# Patient Record
Sex: Male | Born: 1980 | Race: White | Hispanic: No | Marital: Married | Smoking: Never smoker
Health system: Southern US, Community
[De-identification: ages and names within clinical notes are randomized; demographics above are authoritative.]

---

## 2016-08-12 ENCOUNTER — Ambulatory Visit: Payer: Self-pay | Admitting: Physician Assistant

## 2016-08-12 ENCOUNTER — Encounter: Payer: Self-pay | Admitting: Physician Assistant

## 2016-08-12 VITALS — BP 120/84 | HR 78 | Temp 98.6°F

## 2016-08-12 DIAGNOSIS — W57XXXA Bitten or stung by nonvenomous insect and other nonvenomous arthropods, initial encounter: Secondary | ICD-10-CM

## 2016-08-12 MED ORDER — DOXYCYCLINE HYCLATE 100 MG PO TABS: 100.0000 mg | ORAL_TABLET | Freq: Two times a day (BID) | ORAL | 0 refills | Status: DC

## 2016-08-12 NOTE — Progress Notes (Signed)
S:  Body aches, weakness, + fever, chills, had sore throat on day 1 but not now, denies cp/sob, v/d; did pull a few ticks off. Last time was about 7 days ago  Using otc meds: advil, tylenol  O: PE: vitals wnl, nad,  perrl eomi, normocephalic, tms dull,  throat injected, neck supple no lymph, lungs c t a, cv rrr, neuro intact,   A:  Tick bite  P: drink fluids, continue regular meds , use otc meds of choice, return if not improving in 5 days, return earlier if worsening  Doxy 100mg  bid x 14d

## 2016-10-15 ENCOUNTER — Encounter: Payer: Self-pay | Admitting: Physician Assistant

## 2016-10-15 ENCOUNTER — Ambulatory Visit: Payer: Self-pay | Admitting: Family

## 2016-10-15 VITALS — BP 120/80 | HR 77 | Temp 98.0°F

## 2016-10-15 DIAGNOSIS — R059 Cough, unspecified: Secondary | ICD-10-CM

## 2016-10-15 DIAGNOSIS — R05 Cough: Secondary | ICD-10-CM

## 2016-10-15 MED ORDER — PREDNISONE 10 MG (21) PO TBPK: ORAL_TABLET | ORAL | 0 refills | Status: DC

## 2016-10-15 MED ORDER — AZITHROMYCIN 250 MG PO TABS: ORAL_TABLET | ORAL | 0 refills | Status: DC

## 2016-10-15 NOTE — Progress Notes (Signed)
S/: Three-week history of cough, began nasally now  productive of yellow-green mucus, shortness of breath/tightness on exertion. He has been working and Pensions consultant. Denies fever or chills. His children had a similar illness which resolved without medical treatment. Denies past history of asthma or pneumonia. History of bronchitis 1  O/vital signs stable x PO2 95 %  alert pleasant healthy young man with alternating dry then deep cough ENT unremarkable neck supple without nodes heart RSR chest rhonchi rales right chest A/cough ? Pneumonia P/neb treatment given with subjective improvement. PO2 up to 97% sounds in right chest have diminished . Rx Z-Pak and Pred taper 6 days. Supportive measures discussed and encouraged follow-up when necessary not improving.

## 2016-10-22 ENCOUNTER — Ambulatory Visit: Payer: Self-pay | Admitting: Physician Assistant

## 2016-10-23 ENCOUNTER — Encounter: Payer: Self-pay | Admitting: Physician Assistant

## 2016-10-23 ENCOUNTER — Ambulatory Visit: Payer: Self-pay | Admitting: Physician Assistant

## 2016-10-23 VITALS — BP 120/80 | HR 86 | Temp 98.6°F

## 2016-10-23 DIAGNOSIS — R05 Cough: Secondary | ICD-10-CM

## 2016-10-23 DIAGNOSIS — R059 Cough, unspecified: Secondary | ICD-10-CM

## 2016-10-23 MED ORDER — PSEUDOEPH-BROMPHEN-DM 30-2-10 MG/5ML PO SYRP: 5.0000 mL | ORAL_SOLUTION | Freq: Four times a day (QID) | ORAL | 0 refills | Status: DC | PRN

## 2016-10-23 NOTE — Progress Notes (Signed)
   Subjective:    Patient ID: Daniel Miller, male    DOB: 1980-04-18, 36 y.o.   MRN: [id number]  HPI Patient c/o green productive cough for one week. Seen last week and prescribed  Z-Pack and Prednisone 2nd to cough and wheezing. State wheezing as improved.. Took flu shot yesterday.   Review of Systems Negative except for compliant.    Objective:   Physical Exam HEENT unremarkable. Neck supple w/o adenopathy. Lungs CTA and Heart RRR       Assessment & Plan:Cough  [place] DM. Follow up 4 days if no improvement.

## 2016-10-28 ENCOUNTER — Ambulatory Visit
Admission: RE | Admit: 2016-10-28 | Discharge: 2016-10-28 | Disposition: A | Discharge status: Home / Self Care | Payer: 59 | Source: Ambulatory Visit | Attending: Physician Assistant | Admitting: Physician Assistant

## 2016-10-28 ENCOUNTER — Telehealth: Payer: Self-pay | Admitting: Physician Assistant

## 2016-10-28 DIAGNOSIS — R05 Cough: Secondary | ICD-10-CM

## 2016-10-28 DIAGNOSIS — R059 Cough, unspecified: Secondary | ICD-10-CM

## 2016-10-28 DIAGNOSIS — Z0000 Encounter for general adult medical examination without abnormal findings: Secondary | ICD-10-CM | POA: Diagnosis not present

## 2016-10-28 DIAGNOSIS — R918 Other nonspecific abnormal finding of lung field: Secondary | ICD-10-CM | POA: Insufficient documentation

## 2016-10-28 NOTE — Telephone Encounter (Signed)
Pt called the clinic and would like a cxr since the steroid pack didn't really help, order placed in epic

## 2016-10-28 NOTE — Progress Notes (Signed)
Patient contacted office on 10/28/2016 requesting CXR not feeling any better. Per Manuela Schwartz order was placed and patient was notified.

## 2016-10-30 ENCOUNTER — Encounter: Payer: Self-pay | Admitting: Physician Assistant

## 2016-10-30 ENCOUNTER — Ambulatory Visit: Payer: Self-pay | Admitting: Physician Assistant

## 2016-10-30 VITALS — BP 120/80 | HR 81 | Temp 98.4°F

## 2016-10-30 MED ORDER — ALBUTEROL SULFATE HFA 108 (90 BASE) MCG/ACT IN AERS: 2.0000 | INHALATION_SPRAY | Freq: Four times a day (QID) | RESPIRATORY_TRACT | 0 refills | Status: DC | PRN

## 2016-10-30 NOTE — Progress Notes (Signed)
S: C/o continued cough, sx for over 3 weeks, entire family was sick, Daniel Miller gotten better but his cough is lingering, finished zpack, steroid pack, had cxr that showed bronchial changes and hyperinflation, states he got a flu shot a week ago, also went out with his cousin 3 weeks ago and went to a hookah bar, ?if that could have aggravated his lungs?; cough is dry and hacking; is not keeping pt awake at night;  denies cardiac type chest pain or sob, v/d, abd pain, states he flet a lot better while he was on the steroids Remainder ros neg  O: vitals wnl, nad, tms clear, throat injected, neck supple no lymph, lungs c t a, cv rrr, neuro intact, cough is spasmodic, dry  A:  Acute bronchitis   P:  rx medication: albuterol inhaler for 1 week, if not better with inhaler will refer to pulmonology, use otc meds, tylenol or motrin as needed for fever/chills, return if not better in 3 -5 days, return earlier if worsening

## 2016-11-18 ENCOUNTER — Ambulatory Visit: Payer: Self-pay | Admitting: Urology

## 2016-11-24 ENCOUNTER — Ambulatory Visit: Payer: Self-pay | Admitting: Urology

## 2017-02-02 DIAGNOSIS — Z3009 Encounter for other general counseling and advice on contraception: Secondary | ICD-10-CM | POA: Diagnosis not present

## 2017-02-10 ENCOUNTER — Ambulatory Visit: Payer: Self-pay | Admitting: Urology

## 2017-03-30 DIAGNOSIS — Z302 Encounter for sterilization: Secondary | ICD-10-CM | POA: Diagnosis not present

## 2017-12-04 DIAGNOSIS — Z0000 Encounter for general adult medical examination without abnormal findings: Secondary | ICD-10-CM | POA: Diagnosis not present

## 2017-12-07 DIAGNOSIS — Z0000 Encounter for general adult medical examination without abnormal findings: Secondary | ICD-10-CM | POA: Diagnosis not present

## 2017-12-07 DIAGNOSIS — E559 Vitamin D deficiency, unspecified: Secondary | ICD-10-CM | POA: Diagnosis not present

## 2018-03-04 DIAGNOSIS — R1013 Epigastric pain: Secondary | ICD-10-CM | POA: Diagnosis not present

## 2018-04-20 MED FILL — PANTOPRAZOLE SOD DR 40 MG T: 40 | 30 days supply | Qty: 30 | Fill #0

## 2018-04-20 MED FILL — SUCRALFATE 1 GM TABLET: 1 | 30 days supply | Qty: 60 | Fill #0

## 2018-04-28 DIAGNOSIS — R1012 Left upper quadrant pain: Secondary | ICD-10-CM | POA: Diagnosis not present

## 2018-04-28 DIAGNOSIS — K219 Gastro-esophageal reflux disease without esophagitis: Secondary | ICD-10-CM | POA: Diagnosis not present

## 2018-04-29 DIAGNOSIS — R1012 Left upper quadrant pain: Secondary | ICD-10-CM | POA: Diagnosis not present

## 2018-05-16 IMAGING — CR DG CHEST 2V
2 series · 2 of 2 positions shown · non-contrast
Comparison: None.

CLINICAL DATA: Shortness of breath, cough

EXAM:
CHEST  2 VIEW

[chest pa]
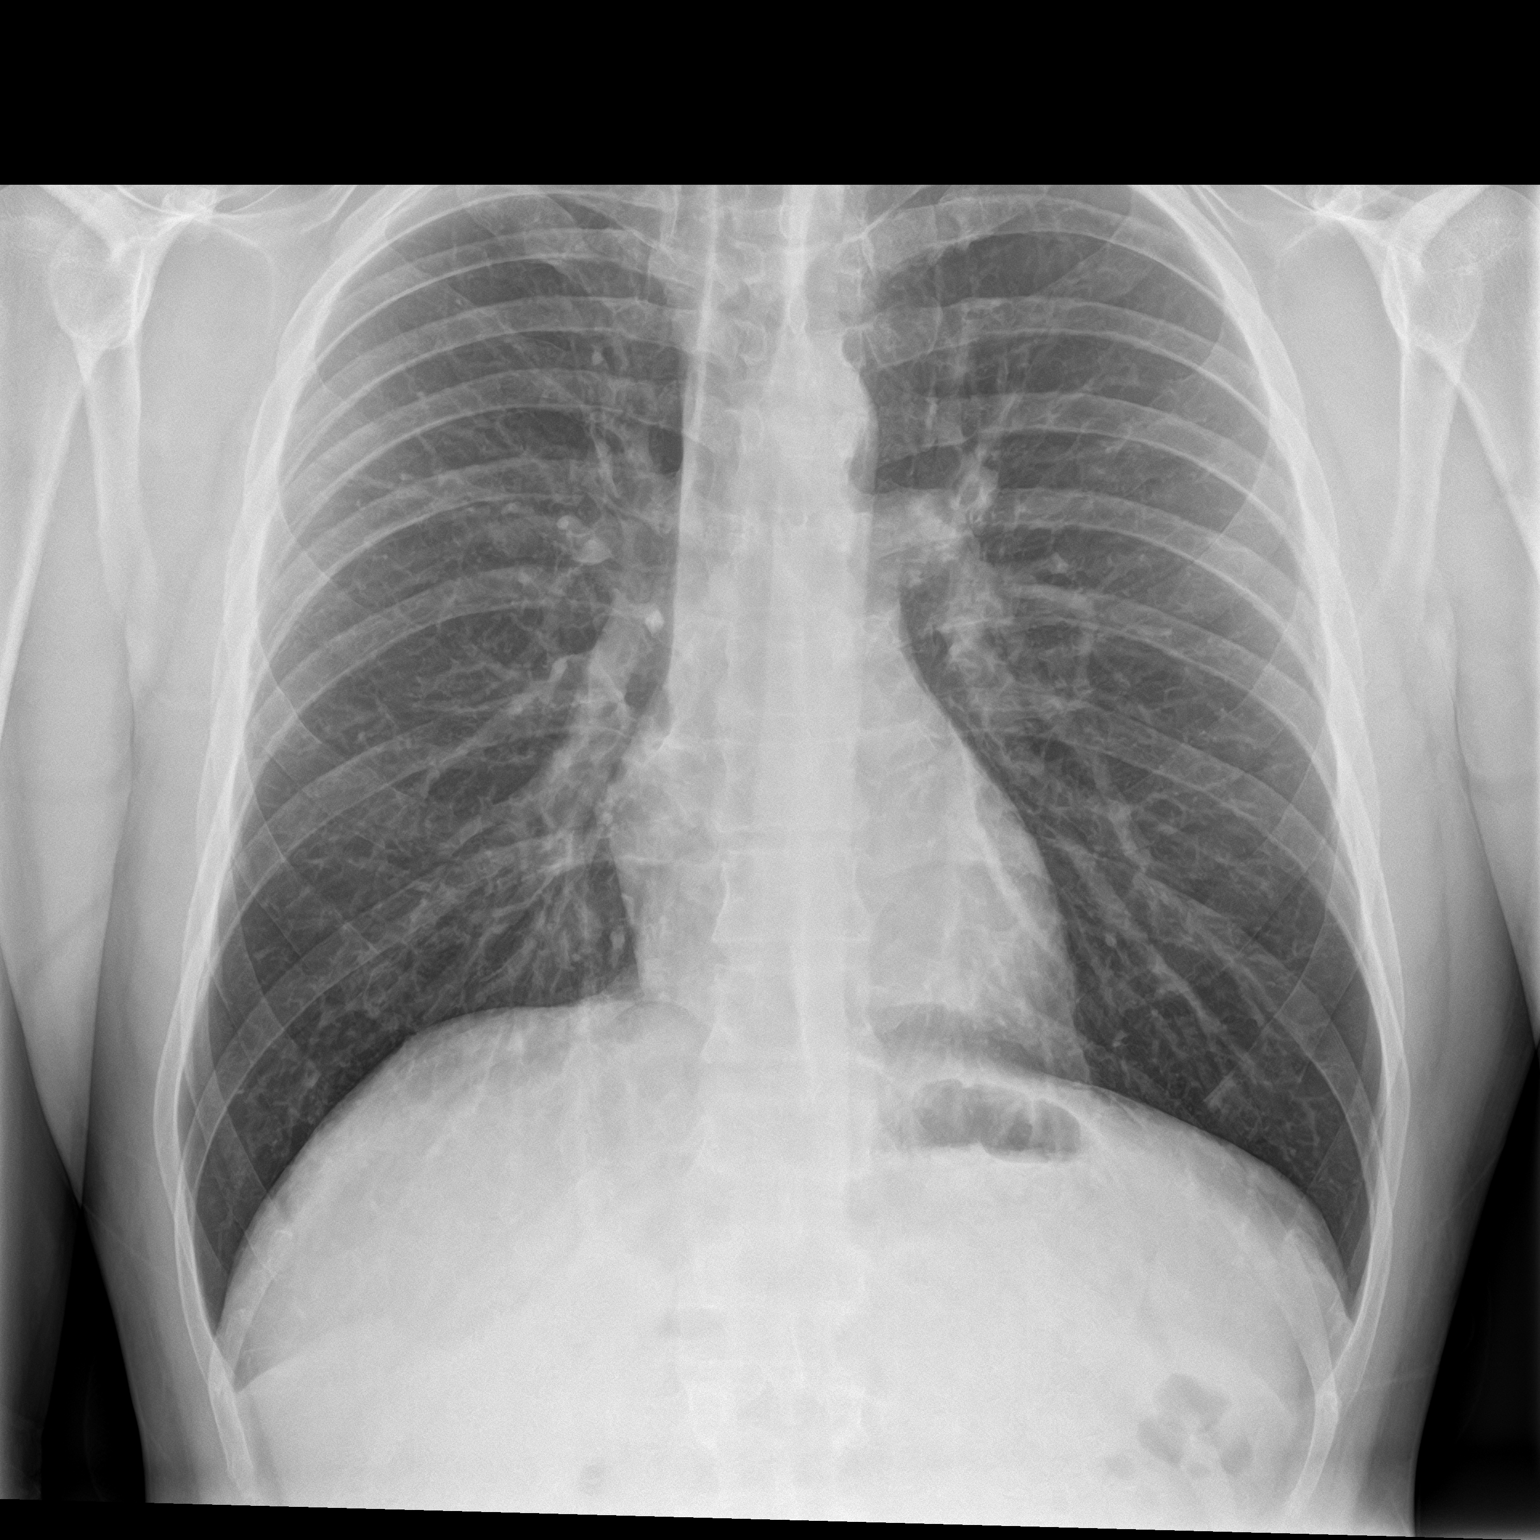

[chest lat]
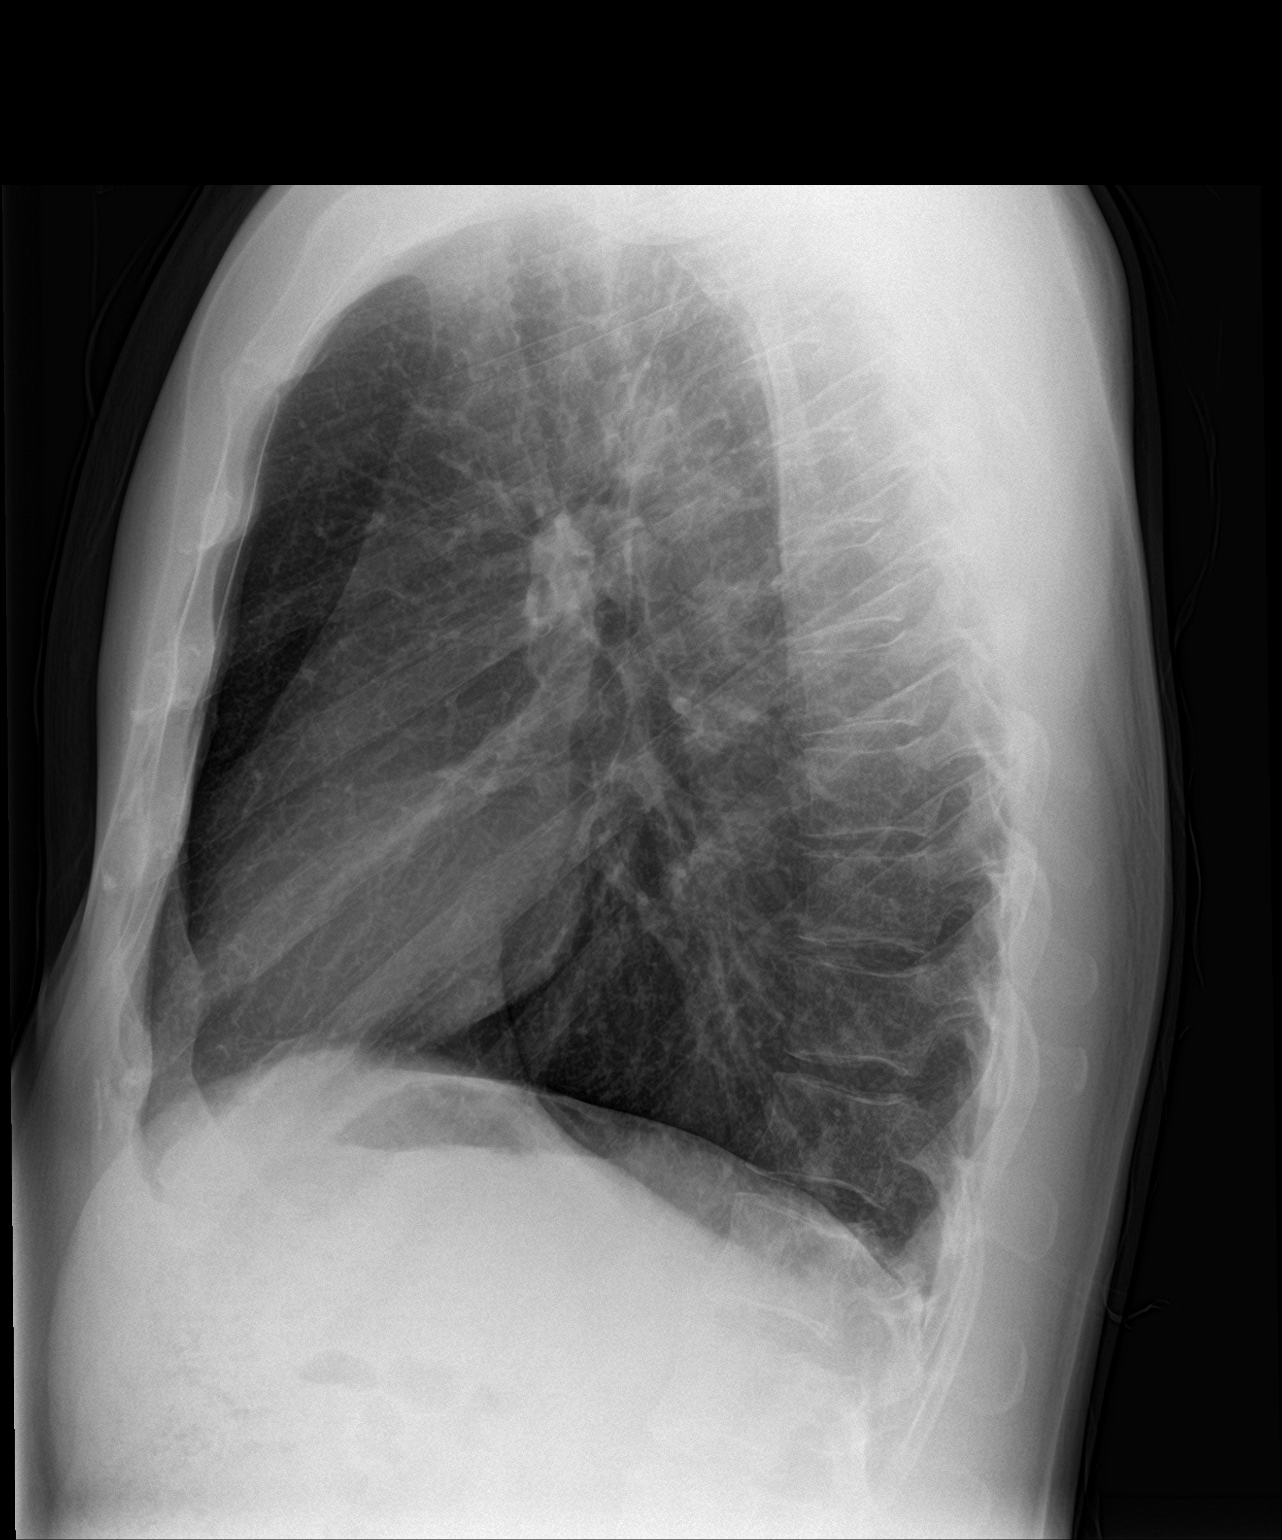

[2 of 2 positions shown; findings below may reference images not displayed]

FINDINGS: Mild hyperinflation of the lungs with peribronchial thickening.
Heart and mediastinal contours are within normal limits. No focal
opacities or effusions. No acute bony abnormality.
IMPRESSION: Mild hyperinflation and bronchitic changes.

## 2018-05-17 MED FILL — PANTOPRAZOLE SOD DR 40 MG T: 40 | 90 days supply | Qty: 90 | Fill #0

## 2018-06-30 DIAGNOSIS — R1013 Epigastric pain: Secondary | ICD-10-CM | POA: Diagnosis not present

## 2018-06-30 DIAGNOSIS — K219 Gastro-esophageal reflux disease without esophagitis: Secondary | ICD-10-CM | POA: Diagnosis not present

## 2018-07-14 DIAGNOSIS — K2980 Duodenitis without bleeding: Secondary | ICD-10-CM | POA: Diagnosis not present

## 2018-07-14 DIAGNOSIS — K449 Diaphragmatic hernia without obstruction or gangrene: Secondary | ICD-10-CM | POA: Diagnosis not present

## 2018-07-14 DIAGNOSIS — K222 Esophageal obstruction: Secondary | ICD-10-CM | POA: Diagnosis not present

## 2018-07-14 DIAGNOSIS — R12 Heartburn: Secondary | ICD-10-CM | POA: Diagnosis not present

## 2018-07-14 DIAGNOSIS — K228 Other specified diseases of esophagus: Secondary | ICD-10-CM | POA: Diagnosis not present

## 2018-09-01 DIAGNOSIS — L305 Pityriasis alba: Secondary | ICD-10-CM | POA: Diagnosis not present

## 2018-09-01 DIAGNOSIS — D225 Melanocytic nevi of trunk: Secondary | ICD-10-CM | POA: Diagnosis not present

## 2018-09-01 DIAGNOSIS — Z85828 Personal history of other malignant neoplasm of skin: Secondary | ICD-10-CM | POA: Diagnosis not present

## 2018-09-01 DIAGNOSIS — L814 Other melanin hyperpigmentation: Secondary | ICD-10-CM | POA: Diagnosis not present

## 2018-11-04 MED FILL — PANTOPRAZOLE SOD DR 40 MG T: 40 | 90 days supply | Qty: 90 | Fill #0

## 2018-12-03 ENCOUNTER — Telehealth: Payer: 59 | Admitting: Physician Assistant

## 2018-12-03 DIAGNOSIS — L255 Unspecified contact dermatitis due to plants, except food: Secondary | ICD-10-CM

## 2018-12-03 MED ORDER — DIPHENHYDRAMINE HCL 25 MG PO CAPS: 25.0000 mg | ORAL_CAPSULE | Freq: Four times a day (QID) | ORAL | 0 refills | Status: DC | PRN

## 2018-12-03 MED ORDER — DEXAMETHASONE 4 MG PO TABS: ORAL_TABLET | ORAL | 1 refills | Status: DC

## 2018-12-03 NOTE — Progress Notes (Signed)
I spent more than 5 min but less than 10 min on this E-visit.   E Visit for Rash  We are sorry that you are not feeling well. Here is how we plan to help!  Based on what you shared with me it looks like you have contact dermatitis.  Contact dermatitis is a skin rash caused by something that touches the skin and causes irritation or inflammation.  Your skin may be red, swollen, dry, cracked, and itch.  The rash should go away in a few days but can last a few weeks.  If you get a rash, it's important to figure out what caused it so the irritant can be avoided in the future.  I am going to order benadryl and decadron for your rash. Please see your MD or clinic if not improving.   HOME CARE:   Take cool showers and avoid direct sunlight.  Apply cool compress or wet dressings.  Take a bath in an oatmeal bath.  Sprinkle content of one Aveeno packet under running faucet with comfortably warm water.  Bathe for 15-20 minutes, 1-2 times daily.  Pat dry with a towel. Do not rub the rash.  Use hydrocortisone cream.  Take an antihistamine like Benadryl for widespread rashes that itch.  The adult dose of Benadryl is 25-50 mg by mouth 4 times daily.  Caution:  This type of medication may cause sleepiness.  Do not drink alcohol, drive, or operate dangerous machinery while taking antihistamines.  Do not take these medications if you have prostate enlargement.  Read package instructions thoroughly on all medications that you take.  GET HELP RIGHT AWAY IF:   Symptoms don't go away after treatment.  Severe itching that persists.  If you rash spreads or swells.  If you rash begins to smell.  If it blisters and opens or develops a yellow-brown crust.  You develop a fever.  You have a sore throat.  You become short of breath.  MAKE SURE YOU:  Understand these instructions. Will watch your condition. Will get help right away if you are not doing well or get worse.  Thank you for choosing  an e-visit. Your e-visit answers were reviewed by a board certified advanced clinical practitioner to complete your personal care plan. Depending upon the condition, your plan could have included both over the counter or prescription medications. Please review your pharmacy choice. Be sure that the pharmacy you have chosen is open so that you can pick up your prescription now.  If there is a problem you may message your provider in [place] to have the prescription routed to another pharmacy. Your safety is important to [place]. If you have drug allergies check your prescription carefully.  For the next 24 hours, you can use MyChart to ask questions about today's visit, request a non-urgent call back, or ask for a work or school excuse from your e-visit provider. You will get an email in the next two days asking about your experience. I hope that your e-visit has been valuable and will speed your recovery.

## 2018-12-09 DIAGNOSIS — F410 Panic disorder [episodic paroxysmal anxiety] without agoraphobia: Secondary | ICD-10-CM | POA: Diagnosis not present

## 2018-12-09 DIAGNOSIS — Z0000 Encounter for general adult medical examination without abnormal findings: Secondary | ICD-10-CM | POA: Diagnosis not present

## 2018-12-09 DIAGNOSIS — E559 Vitamin D deficiency, unspecified: Secondary | ICD-10-CM | POA: Diagnosis not present

## 2018-12-09 DIAGNOSIS — Z13220 Encounter for screening for lipoid disorders: Secondary | ICD-10-CM | POA: Diagnosis not present

## 2018-12-09 DIAGNOSIS — K219 Gastro-esophageal reflux disease without esophagitis: Secondary | ICD-10-CM | POA: Diagnosis not present

## 2018-12-09 MED FILL — ALPRAZolam 0.25 MG TABS: 0.25 | 30 days supply | Qty: 30 | Fill #0

## 2019-08-02 DIAGNOSIS — L821 Other seborrheic keratosis: Secondary | ICD-10-CM | POA: Diagnosis not present

## 2019-08-02 DIAGNOSIS — D1801 Hemangioma of skin and subcutaneous tissue: Secondary | ICD-10-CM | POA: Diagnosis not present

## 2019-08-02 DIAGNOSIS — D225 Melanocytic nevi of trunk: Secondary | ICD-10-CM | POA: Diagnosis not present

## 2019-08-02 DIAGNOSIS — L812 Freckles: Secondary | ICD-10-CM | POA: Diagnosis not present

## 2019-08-02 DIAGNOSIS — Z85828 Personal history of other malignant neoplasm of skin: Secondary | ICD-10-CM | POA: Diagnosis not present

## 2019-08-02 DIAGNOSIS — D2271 Melanocytic nevi of right lower limb, including hip: Secondary | ICD-10-CM | POA: Diagnosis not present

## 2019-08-30 MED FILL — PANTOPRAZOLE SOD DR 40 MG T: 40 | 90 days supply | Qty: 90 | Fill #0

## 2019-12-05 DIAGNOSIS — M25561 Pain in right knee: Secondary | ICD-10-CM | POA: Diagnosis not present

## 2019-12-09 ENCOUNTER — Other Ambulatory Visit: Payer: Self-pay | Admitting: Orthopedic Surgery

## 2019-12-09 DIAGNOSIS — M25561 Pain in right knee: Secondary | ICD-10-CM

## 2019-12-13 ENCOUNTER — Other Ambulatory Visit (HOSPITAL_COMMUNITY): Payer: Self-pay | Admitting: Family Medicine

## 2019-12-13 DIAGNOSIS — F410 Panic disorder [episodic paroxysmal anxiety] without agoraphobia: Secondary | ICD-10-CM | POA: Diagnosis not present

## 2019-12-13 DIAGNOSIS — Z0000 Encounter for general adult medical examination without abnormal findings: Secondary | ICD-10-CM | POA: Diagnosis not present

## 2019-12-13 DIAGNOSIS — Z13220 Encounter for screening for lipoid disorders: Secondary | ICD-10-CM | POA: Diagnosis not present

## 2019-12-13 DIAGNOSIS — E559 Vitamin D deficiency, unspecified: Secondary | ICD-10-CM | POA: Diagnosis not present

## 2019-12-13 DIAGNOSIS — K219 Gastro-esophageal reflux disease without esophagitis: Secondary | ICD-10-CM | POA: Diagnosis not present

## 2019-12-13 MED FILL — ALPRAZolam 0.25 MG TABS: 0.25 | 30 days supply | Qty: 30 | Fill #0

## 2020-01-02 ENCOUNTER — Ambulatory Visit
Admission: RE | Admit: 2020-01-02 | Discharge: 2020-01-02 | Disposition: A | Discharge status: Home / Self Care | Payer: 59 | Source: Ambulatory Visit | Attending: Orthopedic Surgery | Admitting: Orthopedic Surgery

## 2020-01-02 ENCOUNTER — Other Ambulatory Visit: Payer: Self-pay

## 2020-01-02 DIAGNOSIS — M25561 Pain in right knee: Secondary | ICD-10-CM

## 2020-01-02 DIAGNOSIS — M23341 Other meniscus derangements, anterior horn of lateral meniscus, right knee: Secondary | ICD-10-CM | POA: Diagnosis not present

## 2020-01-03 ENCOUNTER — Ambulatory Visit: Payer: 59 | Admitting: Dietician

## 2020-03-26 ENCOUNTER — Ambulatory Visit: Payer: 59 | Admitting: Neurology

## 2020-03-26 ENCOUNTER — Encounter: Payer: Self-pay | Admitting: Neurology

## 2020-03-26 VITALS — BP 115/76 | HR 67 | Ht 68.0 in | Wt 164.0 lb

## 2020-03-26 DIAGNOSIS — Z820 Family history of epilepsy and other diseases of the nervous system: Secondary | ICD-10-CM | POA: Diagnosis not present

## 2020-03-26 DIAGNOSIS — Z9189 Other specified personal risk factors, not elsewhere classified: Secondary | ICD-10-CM | POA: Diagnosis not present

## 2020-03-26 DIAGNOSIS — R0683 Snoring: Secondary | ICD-10-CM | POA: Diagnosis not present

## 2020-03-26 DIAGNOSIS — R351 Nocturia: Secondary | ICD-10-CM | POA: Diagnosis not present

## 2020-03-26 DIAGNOSIS — R0681 Apnea, not elsewhere classified: Secondary | ICD-10-CM | POA: Diagnosis not present

## 2020-03-26 NOTE — Progress Notes (Signed)
Subjective:    Patient ID: Daniel Miller is a 40 y.o. male.  HPI     Star Age, MD, PhD [facility] [address] [place], [place] [address]  Dear Dr. Harrington Challenger,   I saw your patient, Daniel Miller, upon your kind request, In my sleep clinic today for initial consultation of his sleep disorder, in particular, concern for underlying obstructive sleep apnea.  The patient is unaccompanied today.  As you know, [name] is a 40 year old right-handed gentleman with an underlying medical history of basal cell cancer, anxiety, reflux disease, who reports snoring and sleep disruption, often from anxiety at night.  He has had anxiety issues for years, probably close to 10 years but lately, in the past few months he has had nighttime anxiety, even panic-like feeling at night.  He does snore, he does not snore consistently loudly but more so when he has had alcohol.  He has been on Xanax as needed and tries to avoid taking it on a regular basis.  He often just takes a quarter of a pill or even smaller fraction.  He has noticed that he does not wake up as consistently when he has had alcohol.  He does not drink on a daily a regular basis but likes to drink some on weekends, mostly Friday and Saturday nights.  He lives with his wife and 2 children, they have 2 dogs in the household.  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  He does not watch TV in bed.  He tries to keep up scheduled for his sleep, generally in bed before 10:30 PM and rise time is around 6:30 AM.  He has nocturia once per average night, denies any telltale symptoms of restless leg syndrome or recurrent morning headaches.  His brother has sleep apnea and has an AutoPap machine.  Patient drinks caffeine in the form of coffee, generally 1 cup in the morning and has had a second cup at times in the afternoon, before 230.  When his anxiety flared up he quit drinking the second cup of coffee.  He drinks alcohol on the  weekends.  He quit smoking in 2009.  I reviewed your office note from 12/13/2019.  He had blood work through your office on 12/13/2019 and I reviewed the results: Total cholesterol 214, vitamin D 37.9, CMP unremarkable, B12 265.  His Epworth sleepiness score is 9 out of 24, fatigue severity score is 21 out of 63.  His Past Medical History Is Significant For: History reviewed. No pertinent past medical history.  His Past Surgical History Is Significant For: History reviewed. No pertinent surgical history.  His Family History Is Significant For: History reviewed. No pertinent family history.  His Social History Is Significant For: Social History   Socioeconomic History  . Marital status: Married    Spouse name: Not on file  . Number of children: Not on file  . Years of education: Not on file  . Highest education level: Not on file  Occupational History  . Not on file  Tobacco Use  . Smoking status: Never Smoker  . Smokeless tobacco: Never Used  Substance and Sexual Activity  . Alcohol use: Not on file  . Drug use: Not on file  . Sexual activity: Not on file  Other Topics Concern  . Not on file  Social History Narrative  . Not on file   Social Determinants of Health   Financial Resource Strain: Not on  file  Food Insecurity: Not on file  Transportation Needs: Not on file  Physical Activity: Not on file  Stress: Not on file  Social Connections: Not on file    His Allergies Are:  Not on File:   His Current Medications Are:  Outpatient Encounter Medications as of 03/26/2020  Medication Sig  . albuterol (PROVENTIL HFA;VENTOLIN HFA) 108 (90 Base) MCG/ACT inhaler Inhale 2 puffs into the lungs every 6 (six) hours as needed for wheezing or shortness of breath.  . ALPRAZolam (XANAX) 0.25 MG tablet   . brompheniramine-pseudoephedrine-DM 30-2-10 MG/5ML syrup Take 5 mLs by mouth 4 (four) times daily as needed.  Marland Kitchen dexamethasone (DECADRON) 4 MG tablet 1 tab po bid with food  .  diphenhydrAMINE (BENADRYL ALLERGY) 25 mg capsule Take 1 capsule (25 mg total) by mouth every 6 (six) hours as needed.  . pantoprazole (PROTONIX) 40 MG tablet Take 40 mg by mouth daily.  . [DISCONTINUED] ALPRAZolam (XANAX XR) 3 MG 24 hr tablet Frequency:TAKE TABLET  PRN   Dosage:0.0     Instructions:  Note:   No facility-administered encounter medications on file as of 03/26/2020.  :  Review of Systems:  Out of a complete 14 point review of systems, all are reviewed and negative with the exception of these symptoms as listed below: Review of Systems  Neurological:       Here for sleep consult. No prior sleep study some snoring is present. Brother has osa.  Epworth Sleepiness Scale 0= would never doze 1= slight chance of dozing 2= moderate chance of dozing 3= high chance of dozing  Sitting and reading:2 Watching TV:1 Sitting inactive in a public place (ex. Theater or meeting):1 As a passenger in a car for an hour without a break:1 Lying down to rest in the afternoon:2 Sitting and talking to someone:0 Sitting quietly after lunch (no alcohol):2 In a car, while stopped in traffic:0 Total:9     Objective:  Neurological Exam  Physical Exam Physical Examination:   Vitals:   03/26/20 1226  BP: 115/76  Pulse: 67    General Examination: The patient is a very pleasant 40 y.o. male in no acute distress. He appears well-developed and well-nourished and well groomed.   HEENT: Normocephalic, atraumatic, pupils are equal, round and reactive to light, extraocular tracking is good without limitation to gaze excursion or nystagmus noted. Hearing is grossly intact. Face is symmetric with normal facial animation. Speech is clear with no dysarthria noted. There is no hypophonia. There is no lip, neck/head, jaw or voice tremor. Neck is supple with full range of passive and active motion. There are no carotid bruits on auscultation. Oropharynx exam reveals: mild mouth dryness, good dental hygiene  and mild airway crowding, due to small airway entry, tonsillar size of about 1+, normal size uvula, normal tongue, Mallampati class II.  Neck circumference of 15-1/4 inches.  He has a mild overbite.  Tongue protrudes centrally and palate elevates symmetrically.  Chest: Clear to auscultation without wheezing, rhonchi or crackles noted.  Heart: S1+S2+0, regular and normal without murmurs, rubs or gallops noted.   Abdomen: Soft, non-tender and non-distended with normal bowel sounds appreciated on auscultation.  Extremities: There is no pitting edema in the distal lower extremities bilaterally.   Skin: Warm and dry without trophic changes noted.   Musculoskeletal: exam reveals no obvious joint deformities, tenderness or joint swelling or erythema.   Neurologically:  Mental status: The patient is awake, alert and oriented in all 4 spheres. His immediate  and remote memory, attention, language skills and fund of knowledge are appropriate. There is no evidence of aphasia, agnosia, apraxia or anomia. Speech is clear with normal prosody and enunciation. Thought process is linear. Mood is normal and affect is normal.  Cranial nerves II - XII are as described above under HEENT exam.  Motor exam: Normal bulk, strength and tone is noted. There is no tremor, Romberg is negative. Fine motor skills and coordination: grossly intact.  Cerebellar testing: No dysmetria or intention tremor. There is no truncal or gait ataxia.  Sensory exam: intact to light touch in the upper and lower extremities.  Gait, station and balance: He stands easily. No veering to one side is noted. No leaning to one side is noted. Posture is age-appropriate and stance is narrow based. Gait shows normal stride length and normal pace. No problems turning are noted. Tandem walk is unremarkable.                Assessment and Plan:   In summary, Toussaint Golson is a very pleasant 40 y.o.-year old male with an underlying medical history of basal  cell cancer, anxiety, reflux disease, whose history and physical exam are concerning for obstructive sleep apnea (OSA).  It is possible that his nighttime anxiety and panic attacks tie-in with underlying sleep disordered breathing.  Differential diagnosis of course includes anxiety disorder/panic disorder. I had a long chat with the patient about my findings and the diagnosis of OSA, its prognosis and treatment options. We talked about medical treatments, surgical interventions and non-pharmacological approaches. I explained in particular the risks and ramifications of untreated moderate to severe OSA, especially with respect to developing cardiovascular disease down the Road, including congestive heart failure, difficult to treat hypertension, cardiac arrhythmias, or stroke. Even type 2 diabetes has, in part, been linked to untreated OSA. Symptoms of untreated OSA include daytime sleepiness, memory problems, mood irritability and mood disorder such as depression and anxiety, lack of energy, as well as recurrent headaches, especially morning headaches. We talked about trying to maintain a healthy lifestyle in general, as well as the importance of weight control. We also talked about the importance of good sleep hygiene. I recommended the following at this time: sleep study.  I explained the sleep test procedure to the patient and also outlined possible surgical and non-surgical treatment options of OSA, including the use of a custom-made dental device (which would require a referral to a specialist dentist or oral surgeon), upper airway surgical options, such as traditional UPPP or a novel less invasive surgical option in the form of Inspire hypoglossal nerve stimulation (which would involve a referral to an ENT surgeon). I also explained the CPAP treatment option to the patient, who indicated that he would be reluctant but generally speaking willing to try CPAP or AutoPap if the need arises.  I answered all his  questions today and the patient was in agreement. I plan to see him back after the sleep study is completed and encouraged him to call with any interim questions, concerns, problems or updates.   Thank you very much for allowing me to participate in the care of this nice patient. If I can be of any further assistance to you please do not hesitate to call me at [phone].  Sincerely,   Star Age, MD, PhD

## 2020-03-26 NOTE — Patient Instructions (Signed)

## 2020-03-28 ENCOUNTER — Telehealth: Payer: Self-pay

## 2020-03-28 NOTE — Telephone Encounter (Signed)
LVM for pt to call me back to schedule sleep study  

## 2020-04-18 ENCOUNTER — Other Ambulatory Visit: Payer: Self-pay

## 2020-04-18 ENCOUNTER — Ambulatory Visit (INDEPENDENT_AMBULATORY_CARE_PROVIDER_SITE_OTHER): Payer: 59 | Admitting: Neurology

## 2020-04-18 DIAGNOSIS — G4720 Circadian rhythm sleep disorder, unspecified type: Secondary | ICD-10-CM

## 2020-04-18 DIAGNOSIS — R0681 Apnea, not elsewhere classified: Secondary | ICD-10-CM

## 2020-04-18 DIAGNOSIS — R351 Nocturia: Secondary | ICD-10-CM

## 2020-04-18 DIAGNOSIS — Z9189 Other specified personal risk factors, not elsewhere classified: Secondary | ICD-10-CM

## 2020-04-18 DIAGNOSIS — Z820 Family history of epilepsy and other diseases of the nervous system: Secondary | ICD-10-CM

## 2020-04-18 DIAGNOSIS — R0683 Snoring: Secondary | ICD-10-CM

## 2020-04-30 NOTE — Procedures (Signed)
PATIENT'S NAME:  Daniel Miller, Gomer DOB:      Feb 25, 1980      MR#:    [id number]     DATE OF RECORDING: 04/18/2020 REFERRING M.D.:  Lawerance Cruel, MD Study Performed:   Baseline Polysomnogram HISTORY: 40 year old man with a history of basal cell cancer, anxiety, reflux disease, who reports snoring and sleep disruption, often from anxiety at night. The patient endorsed the Epworth Sleepiness Scale at 9 points. The patient's weight 164 pounds with a height of 68 (inches), resulting in a BMI of 24.7 kg/m2. The patient's neck circumference measured 15.2 inches.  CURRENT MEDICATIONS: Proventil HFA, Xanax, Decadron, Benadrylt Allergy, Protonix, Cough syrup   PROCEDURE:  This is a multichannel digital polysomnogram utilizing the Somnostar 11.2 system.  Electrodes and sensors were applied and monitored per AASM Specifications.   EEG, EOG, Chin and Limb EMG, were sampled at 200 Hz.  ECG, Snore and Nasal Pressure, Thermal Airflow, Respiratory Effort, CPAP Flow and Pressure, Oximetry was sampled at 50 Hz. Digital video and audio were recorded.      BASELINE STUDY  Lights Out was at 21:59 and Lights On at 04:54.  Total recording time (TRT) was 416 minutes, with a total sleep time (TST) of 374 minutes.   The patient's sleep latency was 24 minutes.  REM latency was 81 minutes, which is normal.  The sleep efficiency was 89.9 %.     SLEEP ARCHITECTURE: WASO (Wake after sleep onset) was 23 minutes with minimal to mild sleep fragmentation noted. There were 16 minutes in Stage N1, 228 minutes Stage N2, 72 minutes Stage N3 and 58 minutes in Stage REM.  The percentage of Stage N1 was 4.3%, Stage N2 was 61.%, which is mildly elevated, Stage N3 was 19.3%, which is normal, and Stage R (REM sleep) was 15.5%, which is mildly reduced. The arousals were noted as: 52 were spontaneous, 0 were associated with PLMs, 5 were associated with respiratory events.  RESPIRATORY ANALYSIS:  There were a total of 15 respiratory events:  0  obstructive apneas, 0 central apneas and 5 mixed apneas with a total of 5 apneas and an apnea index (AI) of .8 /hour. There were 10 hypopneas with a hypopnea index of 1.6 /hour. The patient also had 0 respiratory event related arousals (RERAs).      The total APNEA/HYPOPNEA INDEX (AHI) was 2.4/hour and the total RESPIRATORY DISTURBANCE INDEX was  2.4 /hour.  10 events occurred in REM sleep and 8 events in NREM. The REM AHI was  10.3 /hour, versus a non-REM AHI of .9. The patient spent 153.5 minutes of total sleep time in the supine position and 221 minutes in non-supine.. The supine AHI was 3.1 versus a non-supine AHI of 1.9.  OXYGEN SATURATION & C02:  The Wake baseline 02 saturation was 97%, with the lowest being 90%. Time spent below 89% saturation equaled 0 minutes.  PERIODIC LIMB MOVEMENTS: The patient had a total of 0 Periodic Limb Movements.  The Periodic Limb Movement (PLM) index was 0 and the PLM Arousal index was 0/hour.  Audio and video analysis did not show any abnormal or unusual movements, behaviors, phonations or vocalizations. The patient took one bathroom break. Mild, intermittent snoring was noted. The EKG was in keeping with normal sinus rhythm (NSR).  Post-study, the patient indicated that sleep was the same as usual.   IMPRESSION:  1. Primary Snoring 2. Dysfunctions associated with sleep stages or arousal from sleep  RECOMMENDATIONS:  1. This study does not  demonstrate any significant obstructive or central sleep disordered breathing with the exception of mild, intermittent snoring and mild, REM related sleep apnea, primarily during supine REM sleep. Treatment with positive airway pressure is not warranted. Avoidance of the supine sleep position may help alleviated his snoring and mild positional and stage-related sleep apnea.  2. This study shows minimal to mild sleep fragmentation and mildly abnormal sleep stage percentages; these are nonspecific findings and per se do not  signify an intrinsic sleep disorder or a cause for the patient's sleep-related symptoms. Causes include (but are not limited to) the first night effect of the sleep study, circadian rhythm disturbances, medication effect or an underlying mood disorder or medical problem.  3. The patient should be cautioned not to drive, work at heights, or operate dangerous or heavy equipment when tired or sleepy. Review and reiteration of good sleep hygiene measures should be pursued with any patient. 4. The patient will be advised to follow up with the referring provider, who will be notified of the test results.  I certify that I have reviewed the entire raw data recording prior to the issuance of this report in accordance with the Standards of Accreditation of the American Academy of Sleep Medicine (AASM)  Star Age, MD, PhD Diplomat, American Board of Neurology and Sleep Medicine (Neurology and Sleep Medicine)

## 2020-04-30 NOTE — Progress Notes (Signed)
Patient referred by Dr. Harrington Challenger, seen by me on 03/26/20, diagnostic PSG on 04/18/20.   Please call and notify the patient that the recent sleep study did not show any significant obstructive sleep apnea with the exception of mild, intermittent snoring and mild, REM related sleep apnea, primarily during supine REM sleep. Treatment with positive airway pressure is not warranted. Avoidance of the supine sleep position may help alleviated his snoring and mild positional and stage-related sleep apnea.   At this juncture, he can follow up with his PCP.   Thanks,  Star Age, MD, PhD [facility])

## 2020-05-01 ENCOUNTER — Telehealth: Payer: Self-pay

## 2020-05-01 NOTE — Telephone Encounter (Signed)
I called the pt and we discussed results of sleep study.  He verbalized understanding and with f/u with PCP. Report has been sent to Dr. Harrington Challenger.

## 2020-05-01 NOTE — Telephone Encounter (Signed)
-----   Message from Star Age, MD sent at 04/30/2020  8:38 AM EDT ----- Patient referred by Dr. Harrington Challenger, seen by me on 03/26/20, diagnostic PSG on 04/18/20.   Please call and notify the patient that the recent sleep study did not show any significant obstructive sleep apnea with the exception of mild, intermittent snoring and mild, REM related sleep apnea, primarily during supine REM sleep. Treatment with positive airway pressure is not warranted. Avoidance of the supine sleep position may help alleviated his snoring and mild positional and stage-related sleep apnea.   At this juncture, he can follow up with his PCP.   Thanks,  Star Age, MD, PhD [facility])

## 2020-06-24 ENCOUNTER — Telehealth: Payer: 59 | Admitting: Family

## 2020-06-24 DIAGNOSIS — L255 Unspecified contact dermatitis due to plants, except food: Secondary | ICD-10-CM

## 2020-06-24 MED ORDER — TRIAMCINOLONE ACETONIDE 0.5 % EX OINT: 1.0000 "application " | TOPICAL_OINTMENT | Freq: Two times a day (BID) | CUTANEOUS | 0 refills | Status: DC

## 2020-06-24 MED ORDER — PREDNISONE 10 MG PO TABS: ORAL_TABLET | ORAL | 0 refills | Status: DC

## 2020-06-24 NOTE — Progress Notes (Signed)
E-Visit for [employer]  We are sorry that you are not feeing well.  Here is how we plan to help!  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  This resin is very sticky, so it easily attaches to your skin, clothing, tools equipment, and pet's fur.    This blistering rash is often called poison ivy rash although it can come from contact with the leaves, stems and roots of poison ivy, poison oak and poison sumac.  The oily resin contains urushiol (u-ROO-she-ol) that produces a skin rash on exposed skin.  The severity of the rash depends on the amount of urushiol that gets on your skin.  A section of skin with more urushiol on it may develop a rash sooner.  The rash usually develops 12-48 hours after exposure and can last two to three weeks.  Your skin must come in direct contact with the plant's oil to be affected.  Blister fluid doesn't spread the rash.  However, if you come into contact with a piece of clothing or pet fur that has urushiol on it, the rash may spread out.  You can also transfer the oil to other parts of your body with your fingers.  Often the rash looks like a straight line because of the way the plant brushes against your skin.  Since your rash is widespread or has resulted in a large number of blisters, I have prescribed an oral corticosteroid.  Please follow these recommendations:  I have sent a prednisone dose pack to your chosen pharmacy. Be sure to follow the instructions carefully and complete the entire prescription. You may use Benadryl or Caladryl topical lotions to sooth the itch and remember cool, not hot, showers and baths can help relieve the itching!  Place cool, wet compresses on the affected area for 15-30 minutes several times a day.  You may also take oral antihistamines, such as diphenhydramine (Benadryl, others), which may also help you sleep better.  Watch your skin for any purulent  (pus) drainage or red streaking from the site.  If this occurs, contact your provider.  You may require an antibiotic for a skin infection.  Make sure that the clothes you were wearing as well as any towels or sheets that may have come in contact with the oil (urushiol) are washed in detergent and hot water.       I have developed the following plan to treat your condition I am prescribing a two week course of steroids (37 tablets of 10 mg prednisone).  Days 1-4 take 4 tablets (40 mg) daily  Days 5-8 take 3 tablets (30 mg) daily, Days 9-11 take 2 tablets (20 mg) daily, Days 12-14 take 1 tablet (10 mg) daily.    I have also sent in a steroid cream that you will use twice a day.   What can you do to prevent this rash?  Avoid the plants.  Learn how to identify poison ivy, poison oak and poison sumac in all seasons.  When hiking or engaging in other activities that might expose you to these plants, try to stay on cleared pathways.  If camping, make sure you pitch your tent in an area free of these plants.  Keep pets from running through wooded areas so that urushiol doesn't accidentally stick to their fur, which you may touch.  Remove or kill the plants.  In your  yard, you can get rid of poison ivy by applying an herbicide or pulling it out of the ground, including the roots, while wearing heavy gloves.  Afterward remove the gloves and thoroughly wash them and your hands.  Don't burn poison ivy or related plants because the urushiol can be carried by smoke.  Wear protective clothing.  If needed, protect your skin by wearing socks, boots, pants, long sleeves and vinyl gloves.  Wash your skin right away.  Washing off the oil with soap and water within 30 minutes of exposure may reduce your chances of getting a poison ivy rash.  Even washing after an hour or so can help reduce the severity of the rash.  If you walk through some poison ivy and then later touch your shoes, you may get some urushiol on your  hands, which may then transfer to your face or body by touching or rubbing.  If the contaminated object isn't cleaned, the urushiol on it can still cause a skin reaction years later.    Be careful not to reuse towels after you have washed your skin.  Also carefully wash clothing in detergent and hot water to remove all traces of the oil.  Handle contaminated clothing carefully so you don't transfer the urushiol to yourself, furniture, rugs or appliances.  Remember that pets can carry the oil on their fur and paws.  If you think your pet may be contaminated with urushiol, put on some long rubber gloves and give your pet a bath.  Finally, be careful not to burn these plants as the smoke can contain traces of the oil.  Inhaling the smoke may result in difficulty breathing. If that occurred you should see a physician as soon as possible.  See your doctor right away if:   The reaction is severe or widespread  You inhaled the smoke from burning poison ivy and are having difficulty breathing  Your skin continues to swell  The rash affects your eyes, mouth or genitals  Blisters are oozing pus  You develop a fever greater than 100 F (37.8 C)  The rash doesn't get better within a few weeks.  If you scratch the poison ivy rash, bacteria under your fingernails may cause the skin to become infected.  See your doctor if pus starts oozing from the blisters.  Treatment generally includes antibiotics.  Poison ivy treatments are usually limited to self-care methods.  And the rash typically goes away on its own in two to three weeks.     If the rash is widespread or results in a large number of blisters, your doctor may prescribe an oral corticosteroid, such as prednisone.  If a bacterial infection has developed at the rash site, your doctor may give you a prescription for an oral antibiotic.  MAKE SURE YOU   Understand these instructions.  Will watch your condition.  Will get help right away if  you are not doing well or get worse.  Thank you for choosing an e-visit. Your e-visit answers were reviewed by a board certified advanced clinical practitioner to complete your personal care plan. Depending upon the condition, your plan could have included both over the counter or prescription medications.  Please review your pharmacy choice. If there is a problem you may use MyChart messaging to have the prescription routed to another pharmacy.   Your safety is important to [place]. If you have drug allergies check your prescription carefully.  You can use MyChart to ask questions about today's  visit, request a non-urgent call back, or ask for a work or school excuse for 24 hours related to this e-Visit. If it has been greater than 24 hours you will need to follow up with your provider, or enter a new e-Visit to address those concerns.   You will get an email in the next two days asking about your experience. I hope that your e-visit has been valuable and will speed your recovery Thank you for choosing an e-visit.     Approximately 5 minutes was spent documenting and reviewing patient's chart.

## 2020-08-02 DIAGNOSIS — D2272 Melanocytic nevi of left lower limb, including hip: Secondary | ICD-10-CM | POA: Diagnosis not present

## 2020-08-02 DIAGNOSIS — L821 Other seborrheic keratosis: Secondary | ICD-10-CM | POA: Diagnosis not present

## 2020-08-02 DIAGNOSIS — Z85828 Personal history of other malignant neoplasm of skin: Secondary | ICD-10-CM | POA: Diagnosis not present

## 2020-08-02 DIAGNOSIS — D225 Melanocytic nevi of trunk: Secondary | ICD-10-CM | POA: Diagnosis not present

## 2020-08-02 DIAGNOSIS — L812 Freckles: Secondary | ICD-10-CM | POA: Diagnosis not present

## 2020-08-14 ENCOUNTER — Other Ambulatory Visit (HOSPITAL_COMMUNITY): Payer: Self-pay

## 2020-08-14 MED ORDER — PANTOPRAZOLE SODIUM 40 MG PO TBEC
40.0000 mg | DELAYED_RELEASE_TABLET | Freq: Every day | ORAL | 0 refills | Status: DC
  Filled 2020-08-14: qty 30, 30d supply, fill #0

## 2020-11-13 ENCOUNTER — Other Ambulatory Visit (HOSPITAL_COMMUNITY): Payer: Self-pay

## 2020-11-14 ENCOUNTER — Other Ambulatory Visit (HOSPITAL_COMMUNITY): Payer: Self-pay

## 2020-11-14 MED ORDER — PANTOPRAZOLE SODIUM 40 MG PO TBEC
40.0000 mg | DELAYED_RELEASE_TABLET | Freq: Every day | ORAL | 0 refills | Status: DC
  Filled 2020-11-14: qty 90, 90d supply, fill #0

## 2020-12-04 DIAGNOSIS — Z6826 Body mass index (BMI) 26.0-26.9, adult: Secondary | ICD-10-CM | POA: Diagnosis not present

## 2020-12-04 DIAGNOSIS — J029 Acute pharyngitis, unspecified: Secondary | ICD-10-CM | POA: Diagnosis not present

## 2020-12-26 ENCOUNTER — Other Ambulatory Visit (HOSPITAL_COMMUNITY): Payer: Self-pay

## 2020-12-26 DIAGNOSIS — K219 Gastro-esophageal reflux disease without esophagitis: Secondary | ICD-10-CM | POA: Diagnosis not present

## 2020-12-26 DIAGNOSIS — R070 Pain in throat: Secondary | ICD-10-CM | POA: Diagnosis not present

## 2020-12-26 MED ORDER — PANTOPRAZOLE SODIUM 40 MG PO TBEC
40.0000 mg | DELAYED_RELEASE_TABLET | Freq: Every day | ORAL | 8 refills | Status: DC
  Filled 2020-12-26: qty 90, 90d supply, fill #0
  Filled 2021-08-07: qty 60, 60d supply, fill #0
  Filled 2021-08-08: qty 90, 90d supply, fill #0
  Filled 2021-12-16 (×2): qty 90, 90d supply, fill #1

## 2021-02-08 ENCOUNTER — Other Ambulatory Visit (HOSPITAL_COMMUNITY): Payer: Self-pay

## 2021-02-08 DIAGNOSIS — F410 Panic disorder [episodic paroxysmal anxiety] without agoraphobia: Secondary | ICD-10-CM | POA: Diagnosis not present

## 2021-02-08 DIAGNOSIS — Z13220 Encounter for screening for lipoid disorders: Secondary | ICD-10-CM | POA: Diagnosis not present

## 2021-02-08 DIAGNOSIS — Z125 Encounter for screening for malignant neoplasm of prostate: Secondary | ICD-10-CM | POA: Diagnosis not present

## 2021-02-08 DIAGNOSIS — Z0000 Encounter for general adult medical examination without abnormal findings: Secondary | ICD-10-CM | POA: Diagnosis not present

## 2021-02-08 MED ORDER — ALPRAZOLAM 0.25 MG PO TABS
0.1250 mg | ORAL_TABLET | Freq: Every day | ORAL | 0 refills | Status: DC | PRN
  Filled 2021-02-08: qty 30, 30d supply, fill #0

## 2021-03-11 ENCOUNTER — Other Ambulatory Visit (HOSPITAL_COMMUNITY): Payer: Self-pay

## 2021-03-11 DIAGNOSIS — K219 Gastro-esophageal reflux disease without esophagitis: Secondary | ICD-10-CM | POA: Diagnosis not present

## 2021-03-11 MED ORDER — PANTOPRAZOLE SODIUM 40 MG PO TBEC
40.0000 mg | DELAYED_RELEASE_TABLET | Freq: Every day | ORAL | 0 refills | Status: DC
Start: 2021-03-11 — End: 2021-12-25
  Filled 2021-03-11: qty 90, 90d supply, fill #0

## 2021-07-26 ENCOUNTER — Other Ambulatory Visit (HOSPITAL_BASED_OUTPATIENT_CLINIC_OR_DEPARTMENT_OTHER): Payer: Self-pay

## 2021-07-26 MED ORDER — PREDNISONE 10 MG PO TABS
ORAL_TABLET | ORAL | 0 refills | Status: DC
  Filled 2021-07-26: qty 30, 12d supply, fill #0

## 2021-07-26 MED ORDER — TRIAMCINOLONE ACETONIDE 0.1 % EX CREA
TOPICAL_CREAM | CUTANEOUS | 0 refills | Status: DC
  Filled 2021-07-26: qty 15, 7d supply, fill #0

## 2021-08-07 ENCOUNTER — Other Ambulatory Visit (HOSPITAL_BASED_OUTPATIENT_CLINIC_OR_DEPARTMENT_OTHER): Payer: Self-pay

## 2021-08-08 ENCOUNTER — Other Ambulatory Visit (HOSPITAL_BASED_OUTPATIENT_CLINIC_OR_DEPARTMENT_OTHER): Payer: Self-pay

## 2021-08-13 DIAGNOSIS — D2262 Melanocytic nevi of left upper limb, including shoulder: Secondary | ICD-10-CM | POA: Diagnosis not present

## 2021-08-13 DIAGNOSIS — D2272 Melanocytic nevi of left lower limb, including hip: Secondary | ICD-10-CM | POA: Diagnosis not present

## 2021-08-13 DIAGNOSIS — D1801 Hemangioma of skin and subcutaneous tissue: Secondary | ICD-10-CM | POA: Diagnosis not present

## 2021-08-13 DIAGNOSIS — L812 Freckles: Secondary | ICD-10-CM | POA: Diagnosis not present

## 2021-08-13 DIAGNOSIS — D2261 Melanocytic nevi of right upper limb, including shoulder: Secondary | ICD-10-CM | POA: Diagnosis not present

## 2021-08-13 DIAGNOSIS — Z85828 Personal history of other malignant neoplasm of skin: Secondary | ICD-10-CM | POA: Diagnosis not present

## 2021-08-13 DIAGNOSIS — D225 Melanocytic nevi of trunk: Secondary | ICD-10-CM | POA: Diagnosis not present

## 2021-08-13 DIAGNOSIS — D2271 Melanocytic nevi of right lower limb, including hip: Secondary | ICD-10-CM | POA: Diagnosis not present

## 2021-08-13 DIAGNOSIS — L649 Androgenic alopecia, unspecified: Secondary | ICD-10-CM | POA: Diagnosis not present

## 2021-12-16 ENCOUNTER — Other Ambulatory Visit (HOSPITAL_COMMUNITY): Payer: Self-pay

## 2021-12-16 ENCOUNTER — Other Ambulatory Visit (HOSPITAL_BASED_OUTPATIENT_CLINIC_OR_DEPARTMENT_OTHER): Payer: Self-pay

## 2021-12-17 ENCOUNTER — Other Ambulatory Visit (HOSPITAL_BASED_OUTPATIENT_CLINIC_OR_DEPARTMENT_OTHER): Payer: Self-pay

## 2021-12-25 ENCOUNTER — Other Ambulatory Visit (HOSPITAL_BASED_OUTPATIENT_CLINIC_OR_DEPARTMENT_OTHER): Payer: Self-pay

## 2021-12-25 ENCOUNTER — Other Ambulatory Visit: Payer: Self-pay

## 2021-12-25 ENCOUNTER — Encounter: Payer: Self-pay | Admitting: Allergy

## 2021-12-25 ENCOUNTER — Ambulatory Visit: Payer: 59 | Admitting: Allergy

## 2021-12-25 VITALS — BP 128/62 | HR 88 | Temp 98.0°F | Resp 20 | Ht 67.0 in | Wt 167.7 lb

## 2021-12-25 DIAGNOSIS — K219 Gastro-esophageal reflux disease without esophagitis: Secondary | ICD-10-CM | POA: Insufficient documentation

## 2021-12-25 DIAGNOSIS — R0602 Shortness of breath: Secondary | ICD-10-CM | POA: Insufficient documentation

## 2021-12-25 DIAGNOSIS — J3089 Other allergic rhinitis: Secondary | ICD-10-CM | POA: Diagnosis not present

## 2021-12-25 DIAGNOSIS — J302 Other seasonal allergic rhinitis: Secondary | ICD-10-CM | POA: Insufficient documentation

## 2021-12-25 MED ORDER — PANTOPRAZOLE SODIUM 40 MG PO TBEC
40.0000 mg | DELAYED_RELEASE_TABLET | Freq: Two times a day (BID) | ORAL | 0 refills | Status: DC
  Filled 2021-12-25 – 2022-03-17 (×2): qty 60, 30d supply, fill #0

## 2021-12-25 MED ORDER — RYALTRIS 665-25 MCG/ACT NA SUSP: 1.0000 | Freq: Two times a day (BID) | NASAL | 5 refills | Status: DC

## 2021-12-25 NOTE — Patient Instructions (Addendum)
Today's skin testing showed: Positive to grass, weed pollen, mold , cat. Negative to common foods.   Results given.  Breathing Today's spirometry was normal.   If your breathing is not better after the below medication changes, we can do a trial of inhaler next.  Environmental allergies Start environmental control measures as below. Use over the counter antihistamines such as Zyrtec (cetirizine), Claritin (loratadine), Allegra (fexofenadine), or Xyzal (levocetirizine) daily as needed. May take twice a day during allergy flares. May switch antihistamines every few months. Start Ryaltris (olopatadine + mometasone nasal spray combination) 1-2 sprays per nostril twice a day. Sample given. This replaces your other nasal sprays. If this works well for you, then have [facility] ship the medication to your home - prescription already sent in.  Nasal saline spray (i.e., Simply Saline) or nasal saline lavage (i.e., NeilMed) is recommended as needed and prior to medicated nasal sprays.  Heartburn: See handout for lifestyle and dietary modifications. Increase  pantoprazole '40mg'$  daily in the morning and at night - nothing to eat or drink for 20-30 minutes afterwards.    Follow up in 1 months or sooner if needed.    Reducing Pollen Exposure Pollen seasons: trees (spring), grass (summer) and ragweed/weeds (fall). Keep windows closed in your home and car to lower pollen exposure.  Install air conditioning in the bedroom and throughout the house if possible.  Avoid going out in dry windy days - especially early morning. Pollen counts are highest between 5 - 10 AM and on dry, hot and windy days.  Save outside activities for late afternoon or after a heavy rain, when pollen levels are lower.  Avoid mowing of grass if you have grass pollen allergy. Be aware that pollen can also be transported indoors on people and pets.  Dry your clothes in an automatic dryer rather than hanging them outside where they  might collect pollen.  Rinse hair and eyes before bedtime.  Mold Control Mold and fungi can grow on a variety of surfaces provided certain temperature and moisture conditions exist.  Outdoor molds grow on plants, decaying vegetation and soil. The major outdoor mold, Alternaria and Cladosporium, are found in very high numbers during hot and dry conditions. Generally, a late summer - fall peak is seen for common outdoor fungal spores. Rain will temporarily lower outdoor mold spore count, but counts rise rapidly when the rainy period ends. The most important indoor molds are Aspergillus and Penicillium. Dark, humid and poorly ventilated basements are ideal sites for mold growth. The next most common sites of mold growth are the bathroom and the kitchen. Outdoor (Seasonal) Mold Control Use air conditioning and keep windows closed. Avoid exposure to decaying vegetation. Avoid leaf raking. Avoid grain handling. Consider wearing a face mask if working in moldy areas.  Indoor (Perennial) Mold Control  Maintain humidity below 50%. Get rid of mold growth on hard surfaces with water, detergent and, if necessary, 5% bleach (do not mix with other cleaners). Then dry the area completely. If mold covers an area more than 10 square feet, consider hiring an indoor environmental professional. For clothing, washing with soap and water is best. If moldy items cannot be cleaned and dried, throw them away. Remove sources e.g. contaminated carpets. Repair and seal leaking roofs or pipes. Using dehumidifiers in damp basements may be helpful, but empty the water and clean units regularly to prevent mildew from forming. All rooms, especially basements, bathrooms and kitchens, require ventilation and cleaning to deter mold and mildew  growth. Avoid carpeting on concrete or damp floors, and storing items in damp areas.  Pet Allergen Avoidance: Contrary to popular opinion, there are no "hypoallergenic" breeds of dogs or  cats. That is because people are not allergic to an animal's hair, but to an allergen found in the animal's saliva, dander (dead skin flakes) or urine. Pet allergy symptoms typically occur within minutes. For some people, symptoms can build up and become most severe 8 to 12 hours after contact with the animal. People with severe allergies can experience reactions in public places if dander has been transported on the pet owners' clothing. Keeping an animal outdoors is only a partial solution, since homes with pets in the yard still have higher concentrations of animal allergens. Before getting a pet, ask your allergist to determine if you are allergic to animals. If your pet is already considered part of your family, try to minimize contact and keep the pet out of the bedroom and other rooms where you spend a great deal of time. As with dust mites, vacuum carpets often or replace carpet with a hardwood floor, tile or linoleum. High-efficiency particulate air (HEPA) cleaners can reduce allergen levels over time. While dander and saliva are the source of cat and dog allergens, urine is the source of allergens from rabbits, hamsters, mice and [place] pigs; so ask a non-allergic family member to clean the animal's cage. If you have a pet allergy, talk to your allergist about the potential for allergy immunotherapy (allergy shots). This strategy can often provide long-term relief.

## 2021-12-25 NOTE — Assessment & Plan Note (Signed)
Noted shortness of breath mainly at night for the past 1 year. No recent inhaler use. Takes PPI daily for GERD. Normal sleep study. Covid-19 infection in Dec 2021. Used albuterol during bronchitis a few years ago. Sometimes has issues with his breathing with exertion.  Today's spirometry was normal. Due to miscommunication patient had 4 puffs xopenex and had a post spirometry done. Pre was not performed.  Today's skin testing showed: Positive to grass, weed pollen, mold , cat. Negative to common foods.  Discussed with patient that I'm not sure how much of the above allergens are contributing to his symptoms. Given testing results and clinical history will start with treating PND and GERD.  If no improvement, will add on 2 month trial of steroid inhaler next.

## 2021-12-25 NOTE — Assessment & Plan Note (Signed)
Doing better with once a day PPI. See handout for lifestyle and dietary modifications. Increase pantoprazole '40mg'$  to twice a day - nothing to eat or drink for 20-30 minutes afterwards.

## 2021-12-25 NOTE — Progress Notes (Signed)
New Patient Note  RE: Daniel Miller MRN: [id number] DOB: [birth year] Date of Office Visit: 12/25/2021  Consult requested by: Lawerance Cruel, MD Primary care provider: Lawerance Cruel, MD  Chief Complaint: Other (Patient not sure what's going on thinks he may have asthma is having some shortness of breath.)  History of Present Illness: I had the pleasure of seeing Daniel Miller for initial evaluation at the [facility] on 12/25/2021. He is a 41 y.o. male, who is self-referred here for the evaluation of breathing issues.  Patient noted shortness of breath after falling asleep and grasping for air only about 1 hour after sleeping. Then he is good for the rest of the night.   Patient had sleep study done which was normal. He started on pantoprazole '40mg'$  daily which is helping his reflux but still having issues with the breathing.   He reports symptoms of shortness of breath, coughing, nocturnal awakenings for 1 year. Current medications include: none. He reports not using aerochamber with inhalers. He tried the following inhalers: albuterol during bronchitis in 2018 with some benefit. Main triggers are unknown. In the last month, frequency of symptoms: daily. Frequency of nocturnal symptoms: daily. Frequency of SABA use: 0x/week. Interference with physical activity: sometimes. Sleep is disturbed. In the last 12 months, emergency room visits/urgent care visits/doctor office visits or hospitalizations due to respiratory issues: no. In the last 12 months, oral steroids courses: no. Lifetime history of hospitalization for respiratory issues: no. Prior intubations: no. History of pneumonia: no. He was evaluated by allergist in the past for environmental allergies. Smoking exposure: quit in 2008. Up to date with flu vaccine: yes. Up to date with COVID-19 vaccine: no. Prior Covid-19 infection: yes in Dec 2021. History of reflux: yes, on PPI daily.  He reports symptoms of throat  clearing, nasal congestion, sneezing, itchy/watery eyes. Symptoms have been going on for 10 years. The symptoms are present all year around with worsening in spring. Anosmia: no. Headache: no. He has used no medications for this. Sinus infections: no. Previous work up includes: skin testing done over 10 years ago which was negative per patient report. Previous ENT evaluation: yes for throat pain due to GERD. Previous sinus imaging: no. History of nasal polyps: no. Last eye exam: a few years ago.  Assessment and Plan: Daniel Miller is a 41 y.o. male with: Shortness of breath Noted shortness of breath mainly at night for the past 1 year. No recent inhaler use. Takes PPI daily for GERD. Normal sleep study. Covid-19 infection in Dec 2021. Used albuterol during bronchitis a few years ago. Sometimes has issues with his breathing with exertion.  Today's spirometry was normal. Due to miscommunication patient had 4 puffs xopenex and had a post spirometry done. Pre was not performed.  Today's skin testing showed: Positive to grass, weed pollen, mold , cat. Negative to common foods.  Discussed with patient that I'm not sure how much of the above allergens are contributing to his symptoms. Given testing results and clinical history will start with treating PND and GERD.  If no improvement, will add on 2 month trial of steroid inhaler next.   Other allergic rhinitis Perennial rhinitis symptoms x 10 years which flares in the spring. Skin prick testing over 10 years ago was negative. Saw ENT for GERD and sore throat. Today's skin testing showed: Positive to grass, weed pollen, mold , cat. Start environmental control measures as below. Use over the counter antihistamines such as Zyrtec (  cetirizine), Claritin (loratadine), Allegra (fexofenadine), or Xyzal (levocetirizine) daily as needed. May take twice a day during allergy flares. May switch antihistamines every few months. Start Ryaltris (olopatadine + mometasone  nasal spray combination) 1-2 sprays per nostril twice a day. Sample given. Demonstrated proper use.  This replaces your other nasal sprays. If this works well for you, then have [facility] ship the medication to your home - prescription already sent in.  Nasal saline spray (i.e., Simply Saline) or nasal saline lavage (i.e., NeilMed) is recommended as needed and prior to medicated nasal sprays.  Gastroesophageal reflux disease Doing better with once a day PPI. See handout for lifestyle and dietary modifications. Increase pantoprazole '40mg'$  to twice a day - nothing to eat or drink for 20-30 minutes afterwards.    Return in about 4 weeks (around 01/22/2022).  Meds ordered this encounter  Medications   Olopatadine-Mometasone (RYALTRIS) 665-25 MCG/ACT SUSP    Sig: Place 1-2 sprays into the nose in the morning and at bedtime.    Dispense:  29 g    Refill:  5    [phone]   pantoprazole (PROTONIX) 40 MG tablet    Sig: Take 1 tablet (40 mg total) by mouth 2 (two) times daily.    Dispense:  60 tablet    Refill:  0   Lab Orders  No laboratory test(s) ordered today    Other allergy screening: Food allergy: no Medication allergy: no Hymenoptera allergy: no Urticaria: no Eczema:no History of recurrent infections suggestive of immunodeficency: no  Diagnostics: Spirometry:  Tracings reviewed. His effort: Good reproducible efforts. FVC: 5.99L FEV1: 5.07L, 132% predicted FEV1/FVC ratio: 85% Interpretation: No overt abnormalities noted given today's efforts.  Due to miscommunication patient had 4 puffs xopenex and had a post spirometry done. Pre was not performed.  Please see scanned spirometry results for details.  Skin Testing: Environmental allergy panel and select foods. Positive to grass, weed pollen, mold , cat. Negative to common foods.  Results discussed with patient/family.  Airborne Adult Perc - 12/25/21 1134     Time Antigen Placed 1059    Allergen Manufacturer Lavella Hammock     Location Back    Number of Test 59    1. Control-Buffer 50% Glycerol Negative    2. Control-Histamine 1 mg/ml 2+    3. Albumin saline Negative    4. Mohnton Negative    5. [place] Negative    6. Johnson Negative    7. Clermont Blue Negative    8. Meadow Fescue Negative    9. Perennial Rye Negative    10. Sweet Vernal Negative    11. Timothy Negative    12. Cocklebur Negative    13. Burweed Marshelder Negative    14. Ragweed, short Negative    15. Ragweed, Giant Negative    16. Plantain,  English Negative    17. Lamb's Quarters Negative    18. Sheep Sorrell Negative    19. Rough Pigweed Negative    20. Marsh Elder, Rough Negative    21. Mugwort, Common Negative    22. Ash mix Negative    23. Birch mix Negative    24. Beech American Negative    25. Box, Elder Negative    26. Cedar, red Negative    27. Cottonwood, [place] Negative    28. Elm mix Negative    29. Hickory Negative    30. Maple mix Negative    31. Oak, [place] mix Negative    32. Pecan Pollen Negative  33. Pine mix Negative    34. Sycamore Eastern Negative    35. McDonough, Black Pollen Negative    36. Alternaria alternata Negative    37. Cladosporium Herbarum Negative    38. Aspergillus mix Negative    39. Penicillium mix Negative    40. Bipolaris sorokiniana (Helminthosporium) Negative    41. Drechslera spicifera (Curvularia) Negative    42. Mucor plumbeus Negative    43. Fusarium moniliforme Negative    44. Aureobasidium pullulans (pullulara) Negative    45. Rhizopus oryzae Negative    46. Botrytis cinera Negative    47. Epicoccum nigrum Negative    48. Phoma betae Negative    49. Candida Albicans Negative    50. Trichophyton mentagrophytes Negative    51. Mite, D Farinae  5,000 AU/ml Negative    52. Mite, D Pteronyssinus  5,000 AU/ml Negative    53. Cat Hair 10,000 BAU/ml Negative    54.  Dog Epithelia Negative    55. Mixed Feathers Negative    56. Horse Epithelia Negative    57. Cockroach, German  Negative    58. Mouse Negative    59. Tobacco Leaf Negative             Food Perc - 12/25/21 1136       Test Information   Time Antigen Placed 1101    Allergen Manufacturer Lavella Hammock    Location Back    Number of allergen test 10      Food   1. Peanut Negative    2. Soybean food Negative    3. Wheat, whole Negative    4. Sesame Negative    5. Milk, cow Negative    6. Egg White, chicken Negative    7. Casein Negative    8. Shellfish mix Negative    9. Fish mix Negative    10. Cashew Negative             Intradermal - 12/25/21 1137     Time Antigen Placed 1117    Allergen Manufacturer Lavella Hammock    Location Back    Number of Test 15    Control Negative    [place] 2+    Johnson Negative    7 Grass Negative    Ragweed mix Negative    Weed mix 2+    Tree mix Negative    Mold 2 3+    Mold 3 Negative    Mold 4 3+    Cat Negative    Dog Negative    Cockroach Negative    Mite mix Negative             Past Medical History: Patient Active Problem List   Diagnosis Date Noted   Shortness of breath 12/25/2021   Other allergic rhinitis 12/25/2021   Gastroesophageal reflux disease 12/25/2021   History reviewed. No pertinent past medical history. Past Surgical History: History reviewed. No pertinent surgical history. Medication List:  Current Outpatient Medications  Medication Sig Dispense Refill   ALPRAZolam (XANAX) 0.25 MG tablet Take 0.5-1 tablets (0.125-0.25 mg total) by mouth daily as needed. 30 tablet 0   Olopatadine-Mometasone (RYALTRIS) 665-25 MCG/ACT SUSP Place 1-2 sprays into the nose in the morning and at bedtime. 29 g 5   pantoprazole (PROTONIX) 40 MG tablet Take 1 tablet (40 mg total) by mouth 2 (two) times daily. 60 tablet 0   No current facility-administered medications for this visit.   Allergies: Not on File Social History: Social History  Socioeconomic History   Marital status: Married    Spouse name: Not on file   Number of children:  Not on file   Years of education: Not on file   Highest education level: Not on file  Occupational History   Not on file  Tobacco Use   Smoking status: Never   Smokeless tobacco: Never  Substance and Sexual Activity   Alcohol use: Not on file   Drug use: Not on file   Sexual activity: Not on file  Other Topics Concern   Not on file  Social History Narrative   Not on file   Social Determinants of Health   Financial Resource Strain: Not on file  Food Insecurity: Not on file  Transportation Needs: Not on file  Physical Activity: Not on file  Stress: Not on file  Social Connections: Not on file   Lives in a 40 year old house. Smoking: quit in 2008 Occupation: IT  Environmental History: Water Damage/mildew in the house: no Carpet in the family room: yes Carpet in the bedroom: yes Heating: gas Cooling: central Pet: yes 1 dog x 2 yrs  Family History: History reviewed. No pertinent family history. Problem                               Relation Asthma                                   no Eczema                                no Food allergy                          no Allergic rhino conjunctivitis     no  Review of Systems  Constitutional:  Negative for appetite change, chills, fever and unexpected weight change.  HENT:  Positive for congestion, postnasal drip, rhinorrhea and sneezing.   Eyes:  Negative for itching.  Respiratory:  Positive for cough and shortness of breath. Negative for chest tightness and wheezing.   Cardiovascular:  Negative for chest pain.  Gastrointestinal:  Negative for abdominal pain.  Genitourinary:  Negative for difficulty urinating.  Skin:  Negative for rash.  Allergic/Immunologic: Positive for environmental allergies. Negative for food allergies.  Neurological:  Negative for headaches.    Objective: BP 128/62   Pulse 88   Temp 98 F (36.7 C)   Resp 20   Ht '5\' 7"'$  (1.702 m)   Wt 167 lb 11.2 oz (76.1 kg)   SpO2 100%   BMI 26.27 kg/m   Body mass index is 26.27 kg/m. Physical Exam Vitals and nursing note reviewed.  Constitutional:      Appearance: Normal appearance. He is well-developed.  HENT:     Head: Normocephalic and atraumatic.     Right Ear: Tympanic membrane and external ear normal.     Left Ear: Tympanic membrane and external ear normal.     Nose: Nose normal.     Mouth/Throat:     Mouth: Mucous membranes are moist.     Pharynx: Oropharynx is clear.  Eyes:     Conjunctiva/sclera: Conjunctivae normal.  Cardiovascular:     Rate and Rhythm: Normal rate and regular rhythm.     Heart  sounds: Normal heart sounds. No murmur heard.    No friction rub. No gallop.  Pulmonary:     Effort: Pulmonary effort is normal.     Breath sounds: Normal breath sounds. No wheezing, rhonchi or rales.  Musculoskeletal:     Cervical back: Neck supple.  Skin:    General: Skin is warm.     Findings: No rash.  Neurological:     Mental Status: He is alert and oriented to person, place, and time.  Psychiatric:        Behavior: Behavior normal.   The plan was reviewed with the patient/family, and all questions/concerned were addressed.  It was my pleasure to see Daniel Miller today and participate in his care. Please feel free to contact me with any questions or concerns.  Sincerely,  Rexene Alberts, DO Allergy & Immunology  [facility] office: [place] office: [phone]

## 2021-12-25 NOTE — Assessment & Plan Note (Signed)
Perennial rhinitis symptoms x 10 years which flares in the spring. Skin prick testing over 10 years ago was negative. Saw ENT for GERD and sore throat. Today's skin testing showed: Positive to grass, weed pollen, mold , cat. Start environmental control measures as below. Use over the counter antihistamines such as Zyrtec (cetirizine), Claritin (loratadine), Allegra (fexofenadine), or Xyzal (levocetirizine) daily as needed. May take twice a day during allergy flares. May switch antihistamines every few months. Start Ryaltris (olopatadine + mometasone nasal spray combination) 1-2 sprays per nostril twice a day. Sample given. Demonstrated proper use.  This replaces your other nasal sprays. If this works well for you, then have [facility] ship the medication to your home - prescription already sent in.  Nasal saline spray (i.e., Simply Saline) or nasal saline lavage (i.e., NeilMed) is recommended as needed and prior to medicated nasal sprays.

## 2022-01-03 ENCOUNTER — Telehealth: Payer: Self-pay | Admitting: Allergy

## 2022-01-03 ENCOUNTER — Other Ambulatory Visit (HOSPITAL_COMMUNITY): Payer: Self-pay

## 2022-01-03 MED ORDER — AZELASTINE-FLUTICASONE 137-50 MCG/ACT NA SUSP
1.0000 | Freq: Two times a day (BID) | NASAL | 5 refills | Status: DC
  Filled 2022-01-03: qty 23, 30d supply, fill #0

## 2022-01-03 NOTE — Telephone Encounter (Signed)
Patient called regarding RX written on 12/25/21: RYALTRIS 665-25 MCG/ACT SUP (nasal spray)  Pt request return call to discuss options. Is there a cheaper alternative? What other options are available?  Patient phone [phone]

## 2022-01-03 NOTE — Telephone Encounter (Signed)
Sent in:  Start dymista (fluticasone + azelastine nasal spray combination) 1 spray per nostril twice a day. This replaces all other nasal sprays.  This may not be covered either but won't know until I send it in.   Otherwise, I'll have to separate the 2 different medications into 2 different nasal sprays.  No other combination nasal sprays available.

## 2022-01-03 NOTE — Telephone Encounter (Signed)
Please advise for an alternative.

## 2022-01-07 ENCOUNTER — Other Ambulatory Visit (HOSPITAL_BASED_OUTPATIENT_CLINIC_OR_DEPARTMENT_OTHER): Payer: Self-pay

## 2022-01-10 ENCOUNTER — Other Ambulatory Visit (HOSPITAL_COMMUNITY): Payer: Self-pay

## 2022-01-10 ENCOUNTER — Telehealth: Payer: Self-pay | Admitting: Allergy

## 2022-01-10 MED ORDER — AZELASTINE-FLUTICASONE 137-50 MCG/ACT NA SUSP
1.0000 | Freq: Two times a day (BID) | NASAL | 5 refills | Status: DC
  Filled 2022-01-10 – 2022-01-15 (×2): qty 23, 30d supply, fill #0

## 2022-01-10 NOTE — Telephone Encounter (Signed)
Patient called and stated that the prescription Dr Maudie Mercury gave him for Azelastine-Fluticasone was not approved by insurance. Patient would like to know if there is an alternative or what should he do. Patients pharmacy is [facility]. Patients call back number is [phone].

## 2022-01-14 ENCOUNTER — Other Ambulatory Visit (HOSPITAL_BASED_OUTPATIENT_CLINIC_OR_DEPARTMENT_OTHER): Payer: Self-pay

## 2022-01-15 ENCOUNTER — Other Ambulatory Visit (HOSPITAL_COMMUNITY): Payer: Self-pay

## 2022-01-15 ENCOUNTER — Other Ambulatory Visit (HOSPITAL_BASED_OUTPATIENT_CLINIC_OR_DEPARTMENT_OTHER): Payer: Self-pay

## 2022-01-15 ENCOUNTER — Telehealth: Payer: Self-pay | Admitting: Allergy

## 2022-01-15 MED ORDER — AZELASTINE HCL 0.1 % NA SOLN
2.0000 | Freq: Two times a day (BID) | NASAL | 5 refills | Status: DC
  Filled 2022-01-15 – 2022-01-16 (×2): qty 30, 50d supply, fill #0

## 2022-01-15 MED ORDER — FLUTICASONE PROPIONATE 50 MCG/ACT NA SUSP
2.0000 | Freq: Every day | NASAL | 5 refills | Status: DC
  Filled 2022-01-15 – 2022-01-16 (×2): qty 16, 30d supply, fill #0

## 2022-01-15 NOTE — Telephone Encounter (Signed)
[facility] called stating patients insurance will not pay for prescriptionRx#:[id number] Azelastine-Fluticasone 137-50 MCG/ACT SUSP [[id number]] asking for another option please advise

## 2022-01-15 NOTE — Telephone Encounter (Signed)
Sending in rx for Flonase and azelastine separately.

## 2022-01-15 NOTE — Telephone Encounter (Signed)
Devided the dymista flonase and azelastine into rx and sent it in

## 2022-01-16 ENCOUNTER — Other Ambulatory Visit (HOSPITAL_BASED_OUTPATIENT_CLINIC_OR_DEPARTMENT_OTHER): Payer: Self-pay

## 2022-01-16 ENCOUNTER — Other Ambulatory Visit (HOSPITAL_COMMUNITY): Payer: Self-pay

## 2022-01-27 ENCOUNTER — Other Ambulatory Visit (HOSPITAL_BASED_OUTPATIENT_CLINIC_OR_DEPARTMENT_OTHER): Payer: Self-pay

## 2022-01-27 MED ORDER — VALACYCLOVIR HCL 1 G PO TABS
1000.0000 mg | ORAL_TABLET | Freq: Three times a day (TID) | ORAL | 0 refills | Status: DC
  Filled 2022-01-27: qty 21, 7d supply, fill #0

## 2022-02-10 ENCOUNTER — Other Ambulatory Visit (HOSPITAL_BASED_OUTPATIENT_CLINIC_OR_DEPARTMENT_OTHER): Payer: Self-pay

## 2022-02-10 DIAGNOSIS — Z23 Encounter for immunization: Secondary | ICD-10-CM | POA: Diagnosis not present

## 2022-02-10 DIAGNOSIS — Z0000 Encounter for general adult medical examination without abnormal findings: Secondary | ICD-10-CM | POA: Diagnosis not present

## 2022-02-10 DIAGNOSIS — Z6825 Body mass index (BMI) 25.0-25.9, adult: Secondary | ICD-10-CM | POA: Diagnosis not present

## 2022-02-10 DIAGNOSIS — Z13220 Encounter for screening for lipoid disorders: Secondary | ICD-10-CM | POA: Diagnosis not present

## 2022-02-10 DIAGNOSIS — K219 Gastro-esophageal reflux disease without esophagitis: Secondary | ICD-10-CM | POA: Diagnosis not present

## 2022-02-10 DIAGNOSIS — Z125 Encounter for screening for malignant neoplasm of prostate: Secondary | ICD-10-CM | POA: Diagnosis not present

## 2022-02-10 DIAGNOSIS — R972 Elevated prostate specific antigen [PSA]: Secondary | ICD-10-CM | POA: Diagnosis not present

## 2022-02-10 MED ORDER — PANTOPRAZOLE SODIUM 40 MG PO TBEC
40.0000 mg | DELAYED_RELEASE_TABLET | Freq: Every day | ORAL | 4 refills | Status: DC
  Filled 2022-02-10: qty 90, 90d supply, fill #0

## 2022-02-12 NOTE — Progress Notes (Unsigned)
Follow Up Note  RE: Daniel Miller MRN: [id number] DOB: 07-20-1980 Date of Office Visit: 02/13/2022  Referring provider: Lawerance Cruel, MD Primary care provider: Lawerance Cruel, MD  Chief Complaint: No chief complaint on file.  History of Present Illness: I had the pleasure of seeing Daniel Miller for a follow up visit at the [facility] on 02/13/2022. He is a 42 y.o. male, who is being followed for shortness of breath, allergic rhinitis and GERD. His previous allergy office visit was on 12/25/2021 with Dr. Maudie Mercury. Today is a regular follow up visit.  Shortness of breath Still having the same sleeping issues and wakes up at night about 2 hours after falling asleep due to trouble breathing. He usually takes a sip of water and repositions himself.  He had sleep study done at a facility which was normal per patient.  Tried PPI Bid dose with no benefit. The Ryaltris helped with morning cough only.  He does have increased stress. He does drink caffeine which causes some palpitations.  No recent CXR.  Allergic rhinitis Ryaltris helped but insurance won't cover it and dymista won't be covered. Not taking any daily antihistamines.    Gastroesophageal reflux disease Took pantoprazole twice a day with no benefit. Now back to once a day.  Assessment and Plan: Daniel Miller is a 42 y.o. male with: Shortness of breath Past history - Noted shortness of breath mainly at night for the past 1 year. No recent inhaler use. Takes PPI daily for GERD. Normal sleep study. Covid-19 infection in Dec 2021. Used albuterol during bronchitis a few years ago. Sometimes has issues with his breathing with exertion. Interim history - increased PPI and Ryaltris didn't help.   Today's spirometry was normal - FEV1 slightly lower than compared to last visit's post bronchodilator spirometry. Get Chest X-ray. Daily controller medication(s): START Symbicort 66mg 2 puffs twice a day with spacer and rinse  mouth afterwards. Spacer given and demonstrated proper use with inhaler. Patient understood technique and all questions/concerned were addressed.  May use albuterol rescue inhaler 2 puffs every 4 to 6 hours as needed for shortness of breath, chest tightness, coughing, and wheezing. Monitor frequency of use.  If no improvement - will refer to cardiology next.  Get spirometry at next visit.  Seasonal and perennial allergic rhinitis Past history - Perennial rhinitis symptoms x 10 years which flares in the spring. Skin prick testing over 10 years ago was negative. Saw ENT for GERD and sore throat. 2023 skin testing showed: Positive to grass, weed pollen, mold, cat. Interim history - Ryaltris helped morning cough but insurance won't cover it. Dymista not covered either. Continue environmental control measures as below. Use over the counter antihistamines such as Zyrtec (cetirizine), Claritin (loratadine), Allegra (fexofenadine), or Xyzal (levocetirizine) daily as needed. May take twice a day during allergy flares. May switch antihistamines every few months. Use azelastine nasal spray 1-2 sprays per nostril at night to help with drainage.  Nasal saline spray (i.e., Simply Saline) or nasal saline lavage (i.e., NeilMed) is recommended as needed and prior to medicated nasal sprays.  Gastroesophageal reflux disease Past history - Doing better with once a day PPI. Interim history - twice a day dosing didn't make any difference.  Continue lifestyle and dietary modifications. Continue pantoprazole '40mg'$  daily in the morning - nothing to eat or drink for 20-30 minutes afterwards.    Return in about 2 months (around 04/14/2022).  Meds ordered this encounter  Medications  budesonide-formoterol (SYMBICORT) 80-4.5 MCG/ACT inhaler    Sig: Inhale 2 puffs into the lungs in the morning and at bedtime. with spacer and rinse mouth afterwards.    Dispense:  10.2 g    Refill:  3    Dispense generic or branded  whichever is covered.   albuterol (VENTOLIN HFA) 108 (90 Base) MCG/ACT inhaler    Sig: Inhale 2 puffs into the lungs every 4 (four) hours as needed for wheezing or shortness of breath (coughing fits).    Dispense:  6.7 g    Refill:  1   Lab Orders  No laboratory test(s) ordered today    Diagnostics: Spirometry:  Tracings reviewed. His effort: Good reproducible efforts. FVC: 5.88L FEV1: 4.59L, 120% predicted FEV1/FVC ratio: 78% Interpretation: Spirometry consistent with normal pattern.  Please see scanned spirometry results for details. Spirometry was not as good as post from last visit but still normal.   Medication List:  Current Outpatient Medications  Medication Sig Dispense Refill   albuterol (VENTOLIN HFA) 108 (90 Base) MCG/ACT inhaler Inhale 2 puffs into the lungs every 4 (four) hours as needed for wheezing or shortness of breath (coughing fits). 6.7 g 1   ALPRAZolam (XANAX) 0.25 MG tablet Take 0.5-1 tablets (0.125-0.25 mg total) by mouth daily as needed. 30 tablet 0   budesonide-formoterol (SYMBICORT) 80-4.5 MCG/ACT inhaler Inhale 2 puffs into the lungs in the morning and at bedtime. with spacer and rinse mouth afterwards. 10.2 g 3   pantoprazole (PROTONIX) 40 MG tablet Take 1 tablet (40 mg total) by mouth 2 (two) times daily. 60 tablet 0   azelastine (ASTELIN) 0.1 % nasal spray Place 2 sprays into both nostrils 2 (two) times daily. (Patient not taking: Reported on 02/13/2022) 30 mL 5   No current facility-administered medications for this visit.   Allergies: No Known Allergies I reviewed his past medical history, social history, family history, and environmental history and no significant changes have been reported from his previous visit.  Review of Systems  Constitutional:  Negative for appetite change, chills, fever and unexpected weight change.  HENT:  Negative for congestion and sneezing.   Eyes:  Negative for itching.  Respiratory:  Positive for cough and shortness  of breath. Negative for chest tightness and wheezing.   Cardiovascular:  Negative for chest pain.  Gastrointestinal:  Negative for abdominal pain.  Genitourinary:  Negative for difficulty urinating.  Skin:  Negative for rash.  Allergic/Immunologic: Positive for environmental allergies. Negative for food allergies.  Neurological:  Negative for headaches.    Objective: BP (!) 122/90   Pulse 82   Temp 98.1 F (36.7 C) (Temporal)   Resp 18   Ht '5\' 7"'$  (1.702 m)   SpO2 98%   BMI 26.27 kg/m  Body mass index is 26.27 kg/m. Physical Exam Vitals and nursing note reviewed.  Constitutional:      Appearance: Normal appearance. He is well-developed.  HENT:     Head: Normocephalic and atraumatic.     Right Ear: Tympanic membrane and external ear normal.     Left Ear: Tympanic membrane and external ear normal.     Nose: Nose normal.     Mouth/Throat:     Mouth: Mucous membranes are moist.     Pharynx: Oropharynx is clear.  Eyes:     Conjunctiva/sclera: Conjunctivae normal.  Cardiovascular:     Rate and Rhythm: Normal rate and regular rhythm.     Heart sounds: Normal heart sounds. No murmur heard.  No friction rub. No gallop.  Pulmonary:     Effort: Pulmonary effort is normal.     Breath sounds: Normal breath sounds. No wheezing, rhonchi or rales.  Musculoskeletal:     Cervical back: Neck supple.  Skin:    General: Skin is warm.     Findings: No rash.  Neurological:     Mental Status: He is alert and oriented to person, place, and time.  Psychiatric:        Behavior: Behavior normal.   Previous notes and tests were reviewed. The plan was reviewed with the patient/family, and all questions/concerned were addressed.  It was my pleasure to see Daniel Miller today and participate in his care. Please feel free to contact me with any questions or concerns.  Sincerely,  Rexene Alberts, DO Allergy & Immunology  [facility] office: [place] office: [phone]

## 2022-02-13 ENCOUNTER — Ambulatory Visit: Payer: Commercial Managed Care - PPO | Admitting: Allergy

## 2022-02-13 ENCOUNTER — Encounter: Payer: Self-pay | Admitting: Allergy

## 2022-02-13 ENCOUNTER — Other Ambulatory Visit (HOSPITAL_COMMUNITY): Payer: Self-pay

## 2022-02-13 VITALS — BP 122/90 | HR 82 | Temp 98.1°F | Resp 18 | Ht 67.0 in

## 2022-02-13 DIAGNOSIS — K219 Gastro-esophageal reflux disease without esophagitis: Secondary | ICD-10-CM | POA: Diagnosis not present

## 2022-02-13 DIAGNOSIS — J45998 Other asthma: Secondary | ICD-10-CM | POA: Diagnosis not present

## 2022-02-13 DIAGNOSIS — J3089 Other allergic rhinitis: Secondary | ICD-10-CM | POA: Diagnosis not present

## 2022-02-13 DIAGNOSIS — R0602 Shortness of breath: Secondary | ICD-10-CM | POA: Diagnosis not present

## 2022-02-13 DIAGNOSIS — J302 Other seasonal allergic rhinitis: Secondary | ICD-10-CM

## 2022-02-13 MED ORDER — BUDESONIDE-FORMOTEROL FUMARATE 80-4.5 MCG/ACT IN AERO
2.0000 | INHALATION_SPRAY | Freq: Two times a day (BID) | RESPIRATORY_TRACT | 3 refills | Status: DC
  Filled 2022-02-13 – 2022-02-14 (×2): qty 10.2, 30d supply, fill #0
  Filled 2022-03-20: qty 10.2, 30d supply, fill #1

## 2022-02-13 MED ORDER — ALBUTEROL SULFATE HFA 108 (90 BASE) MCG/ACT IN AERS
2.0000 | INHALATION_SPRAY | RESPIRATORY_TRACT | 1 refills | Status: DC | PRN
  Filled 2022-02-13: qty 6.7, 25d supply, fill #0
  Filled 2022-02-14: qty 6.7, 17d supply, fill #0

## 2022-02-13 NOTE — Assessment & Plan Note (Signed)
Past history - Doing better with once a day PPI. Interim history - twice a day dosing didn't make any difference.  Continue lifestyle and dietary modifications. Continue pantoprazole '40mg'$  daily in the morning - nothing to eat or drink for 20-30 minutes afterwards.

## 2022-02-13 NOTE — Assessment & Plan Note (Signed)
Past history - Perennial rhinitis symptoms x 10 years which flares in the spring. Skin prick testing over 10 years ago was negative. Saw ENT for GERD and sore throat. 2023 skin testing showed: Positive to grass, weed pollen, mold, cat. Interim history - Ryaltris helped morning cough but insurance won't cover it. Dymista not covered either. Continue environmental control measures as below. Use over the counter antihistamines such as Zyrtec (cetirizine), Claritin (loratadine), Allegra (fexofenadine), or Xyzal (levocetirizine) daily as needed. May take twice a day during allergy flares. May switch antihistamines every few months. Use azelastine nasal spray 1-2 sprays per nostril at night to help with drainage.  Nasal saline spray (i.e., Simply Saline) or nasal saline lavage (i.e., NeilMed) is recommended as needed and prior to medicated nasal sprays.

## 2022-02-13 NOTE — Assessment & Plan Note (Signed)
Past history - Noted shortness of breath mainly at night for the past 1 year. No recent inhaler use. Takes PPI daily for GERD. Normal sleep study. Covid-19 infection in Dec 2021. Used albuterol during bronchitis a few years ago. Sometimes has issues with his breathing with exertion. Interim history - increased PPI and Ryaltris didn't help.   Today's spirometry was normal - FEV1 slightly lower than compared to last visit's post bronchodilator spirometry. Get Chest X-ray. Daily controller medication(s): START Symbicort 33mg 2 puffs twice a day with spacer and rinse mouth afterwards. Spacer given and demonstrated proper use with inhaler. Patient understood technique and all questions/concerned were addressed.  May use albuterol rescue inhaler 2 puffs every 4 to 6 hours as needed for shortness of breath, chest tightness, coughing, and wheezing. Monitor frequency of use.  If no improvement - will refer to cardiology next.  Get spirometry at next visit.

## 2022-02-13 NOTE — Patient Instructions (Addendum)
Breathing Today's spirometry was normal.   Get Chest X-ray. Daily controller medication(s): START Symbicort 20mg 2 puffs twice a day with spacer and rinse mouth afterwards. Spacer given and demonstrated proper use with inhaler. Patient understood technique and all questions/concerned were addressed.  May use albuterol rescue inhaler 2 puffs every 4 to 6 hours as needed for shortness of breath, chest tightness, coughing, and wheezing. Monitor frequency of use.  Breathing control goals:  Full participation in all desired activities (may need albuterol before activity) Albuterol use two times or less a week on average (not counting use with activity) Cough interfering with sleep two times or less a month Oral steroids no more than once a year No hospitalizations   If no improvement - will refer to cardiology next.   Environmental allergies 2023 skin testing showed: Positive to grass, weed pollen, mold , cat. Continue environmental control measures as below. Use over the counter antihistamines such as Zyrtec (cetirizine), Claritin (loratadine), Allegra (fexofenadine), or Xyzal (levocetirizine) daily as needed. May take twice a day during allergy flares. May switch antihistamines every few months. Use azelastine nasal spray 1-2 sprays per nostril at night to help with drainage.  Nasal saline spray (i.e., Simply Saline) or nasal saline lavage (i.e., NeilMed) is recommended as needed and prior to medicated nasal sprays.  Heartburn: Continue lifestyle and dietary modifications. Continue pantoprazole '40mg'$  daily in the morning - nothing to eat or drink for 20-30 minutes afterwards.    Follow up in 2 months or sooner if needed.    Reducing Pollen Exposure Pollen seasons: trees (spring), grass (summer) and ragweed/weeds (fall). Keep windows closed in your home and car to lower pollen exposure.  Install air conditioning in the bedroom and throughout the house if possible.  Avoid going out in dry  windy days - especially early morning. Pollen counts are highest between 5 - 10 AM and on dry, hot and windy days.  Save outside activities for late afternoon or after a heavy rain, when pollen levels are lower.  Avoid mowing of grass if you have grass pollen allergy. Be aware that pollen can also be transported indoors on people and pets.  Dry your clothes in an automatic dryer rather than hanging them outside where they might collect pollen.  Rinse hair and eyes before bedtime.  Mold Control Mold and fungi can grow on a variety of surfaces provided certain temperature and moisture conditions exist.  Outdoor molds grow on plants, decaying vegetation and soil. The major outdoor mold, Alternaria and Cladosporium, are found in very high numbers during hot and dry conditions. Generally, a late summer - fall peak is seen for common outdoor fungal spores. Rain will temporarily lower outdoor mold spore count, but counts rise rapidly when the rainy period ends. The most important indoor molds are Aspergillus and Penicillium. Dark, humid and poorly ventilated basements are ideal sites for mold growth. The next most common sites of mold growth are the bathroom and the kitchen. Outdoor (Seasonal) Mold Control Use air conditioning and keep windows closed. Avoid exposure to decaying vegetation. Avoid leaf raking. Avoid grain handling. Consider wearing a face mask if working in moldy areas.  Indoor (Perennial) Mold Control  Maintain humidity below 50%. Get rid of mold growth on hard surfaces with water, detergent and, if necessary, 5% bleach (do not mix with other cleaners). Then dry the area completely. If mold covers an area more than 10 square feet, consider hiring an indoor environmental professional. For clothing, washing with soap  and water is best. If moldy items cannot be cleaned and dried, throw them away. Remove sources e.g. contaminated carpets. Repair and seal leaking roofs or pipes. Using  dehumidifiers in damp basements may be helpful, but empty the water and clean units regularly to prevent mildew from forming. All rooms, especially basements, bathrooms and kitchens, require ventilation and cleaning to deter mold and mildew growth. Avoid carpeting on concrete or damp floors, and storing items in damp areas.  Pet Allergen Avoidance: Contrary to popular opinion, there are no "hypoallergenic" breeds of dogs or cats. That is because people are not allergic to an animal's hair, but to an allergen found in the animal's saliva, dander (dead skin flakes) or urine. Pet allergy symptoms typically occur within minutes. For some people, symptoms can build up and become most severe 8 to 12 hours after contact with the animal. People with severe allergies can experience reactions in public places if dander has been transported on the pet owners' clothing. Keeping an animal outdoors is only a partial solution, since homes with pets in the yard still have higher concentrations of animal allergens. Before getting a pet, ask your allergist to determine if you are allergic to animals. If your pet is already considered part of your family, try to minimize contact and keep the pet out of the bedroom and other rooms where you spend a great deal of time. As with dust mites, vacuum carpets often or replace carpet with a hardwood floor, tile or linoleum. High-efficiency particulate air (HEPA) cleaners can reduce allergen levels over time. While dander and saliva are the source of cat and dog allergens, urine is the source of allergens from rabbits, hamsters, mice and [place] pigs; so ask a non-allergic family member to clean the animal's cage. If you have a pet allergy, talk to your allergist about the potential for allergy immunotherapy (allergy shots). This strategy can often provide long-term relief.

## 2022-02-14 ENCOUNTER — Other Ambulatory Visit (HOSPITAL_BASED_OUTPATIENT_CLINIC_OR_DEPARTMENT_OTHER): Payer: Self-pay

## 2022-02-17 ENCOUNTER — Other Ambulatory Visit (HOSPITAL_COMMUNITY): Payer: Self-pay

## 2022-03-17 ENCOUNTER — Other Ambulatory Visit: Payer: Self-pay

## 2022-03-17 ENCOUNTER — Other Ambulatory Visit (HOSPITAL_BASED_OUTPATIENT_CLINIC_OR_DEPARTMENT_OTHER): Payer: Self-pay

## 2022-03-17 MED ORDER — ALPRAZOLAM 0.25 MG PO TABS
0.1250 mg | ORAL_TABLET | Freq: Every day | ORAL | 0 refills | Status: AC | PRN
  Filled 2022-03-17: qty 30, 30d supply, fill #0

## 2022-03-24 ENCOUNTER — Institutional Professional Consult (permissible substitution): Payer: Commercial Managed Care - PPO | Admitting: Nurse Practitioner

## 2022-04-05 NOTE — Progress Notes (Signed)
04/07/22- 63 yoM never smoker for sleep evaluation self-referred  Medical problem list includes Allergic Rhinitis, GERD, Dyspnea,  NPSG 04/18/21 ([facility]/Dr Athar) AHI 2.4/ hr, desaturation to 90%, body weight 164 lbs -Meds include- Ventolin hfa, Symbicort 80, Astelin, alprazolam, Epworth score-6 Body weight today-164 lbs Covid vaxx- Flu vax- -----Patient had sleep study in 2022, pt advises study was negative. But has episodes of waking up gasping for breath and sob. Wakes up within first few hours of sleep. Feels exhausted in the morning. Trial of inhalers for possible asthma made no difference. Describes waking suddenly, repeatedly in first few hours of sleep with trouble breathing. On 1 occasion wife noted he was gasping for breath as he sat up. Not associated with recalled dreams.No complex parasomnia. History of panic attacks. Xanax didn't help sleep. Asthma inhaler no help. Brother with OSA/ CPAP. No ENT surgery. Not aware of heart or lung problems.  1-2 cups AM coffee. Tired  Prior to Admission medications   Medication Sig Start Date End Date Taking? Authorizing Provider  albuterol (VENTOLIN HFA) 108 (90 Base) MCG/ACT inhaler Inhale 2 puffs into the lungs every 4 (four) hours as needed for wheezing or shortness of breath (coughing fits). 02/13/22  Yes Garnet Sierras, DO  ALPRAZolam Duanne Moron) 0.25 MG tablet Take 0.5-1 tablets (0.125-0.25 mg total) by mouth daily as needed. 03/17/22  Yes   pantoprazole (PROTONIX) 40 MG tablet Take 1 tablet (40 mg total) by mouth 2 (two) times daily. 12/25/21  Yes Garnet Sierras, DO  azelastine (ASTELIN) 0.1 % nasal spray Place 2 sprays into both nostrils 2 (two) times daily. 01/15/22   Garnet Sierras, DO  budesonide-formoterol (SYMBICORT) 80-4.5 MCG/ACT inhaler Inhale 2 puffs into the lungs in the morning and at bedtime. with spacer and rinse mouth afterwards. 02/13/22   Garnet Sierras, DO   No past medical history on file. No past surgical history on file. No family history  on file.  ROS-see HPI   + = positive Constitutional:    weight loss, night sweats, fevers, chills, fatigue, lassitude. HEENT:    headaches, difficulty swallowing, tooth/dental problems, sore throat,       sneezing, itching, ear ache, nasal congestion, post nasal drip, snoring CV:    chest pain, orthopnea, PND, swelling in lower extremities, anasarca,                                   dizziness, palpitations Resp:   shortness of breath with exertion or at rest.                productive cough,   non-productive cough, coughing up of blood.              change in color of mucus.  wheezing.   Skin:    rash or lesions. GI:  No-   heartburn, indigestion, abdominal pain, nausea, vomiting, diarrhea,                 change in bowel habits, loss of appetite GU: dysuria, change in color of urine, no urgency or frequency.   flank pain. MS:   joint pain, stiffness, decreased range of motion, back pain. Neuro-     nothing unusual Psych:  change in mood or affect.  depression or anxiety.   memory loss.  OBJ- Physical Exam General- Alert, Oriented, Affect-appropriate, Distress- none acute, + muscular,not obese Skin- rash-none, lesions- none, excoriation- none Lymphadenopathy-  none Head- atraumatic            Eyes- Gross vision intact, PERRLA, conjunctivae and secretions clear            Ears- Hearing, canals-normal            Nose- Clear, no-Septal dev, mucus, polyps, erosion, perforation             Throat- Mallampati IV , mucosa clear , drainage- none, tonsils- atrophic, + teeth Neck- flexible , trachea midline, no stridor , thyroid nl, carotid no bruit Chest - symmetrical excursion , unlabored           Heart/CV- RRR , no murmur , no gallop  , no rub, nl s1 s2                           - JVD- none , edema- none, stasis changes- none, varices- none           Lung- clear to P&A, wheeze- none, cough- none , dullness-none, rub- none           Chest wall-  Abd-  Br/ Gen/ Rectal- Not done, not  indicated Extrem- cyanosis- none, clubbing, none, atrophy- none, strength- nl Neuro- grossly intact to observation

## 2022-04-07 ENCOUNTER — Ambulatory Visit (INDEPENDENT_AMBULATORY_CARE_PROVIDER_SITE_OTHER): Payer: Commercial Managed Care - PPO | Admitting: Internal Medicine

## 2022-04-07 ENCOUNTER — Encounter: Payer: Self-pay | Admitting: Internal Medicine

## 2022-04-07 VITALS — BP 116/78 | HR 80 | Ht 68.0 in | Wt 164.8 lb

## 2022-04-07 DIAGNOSIS — K219 Gastro-esophageal reflux disease without esophagitis: Secondary | ICD-10-CM | POA: Diagnosis not present

## 2022-04-07 DIAGNOSIS — G4730 Sleep apnea, unspecified: Secondary | ICD-10-CM

## 2022-04-07 DIAGNOSIS — G479 Sleep disorder, unspecified: Secondary | ICD-10-CM | POA: Diagnosis not present

## 2022-04-07 NOTE — Patient Instructions (Signed)
Order- schedule home sleep test      dx sleep disordered breathing  Please call us about 2 weeks after your sleep test  for results and recommendations

## 2022-04-10 ENCOUNTER — Ambulatory Visit: Payer: Commercial Managed Care - PPO

## 2022-04-10 DIAGNOSIS — G4733 Obstructive sleep apnea (adult) (pediatric): Secondary | ICD-10-CM | POA: Diagnosis not present

## 2022-04-10 DIAGNOSIS — G4730 Sleep apnea, unspecified: Secondary | ICD-10-CM

## 2022-04-15 ENCOUNTER — Encounter: Payer: Self-pay | Admitting: Internal Medicine

## 2022-04-15 DIAGNOSIS — G479 Sleep disorder, unspecified: Secondary | ICD-10-CM | POA: Insufficient documentation

## 2022-04-15 DIAGNOSIS — G4733 Obstructive sleep apnea (adult) (pediatric): Secondary | ICD-10-CM | POA: Insufficient documentation

## 2022-04-15 NOTE — Assessment & Plan Note (Signed)
I do not think he is describing reflux events and sleep but we can watch for that.  Standard advice for reflux precautions.

## 2022-04-15 NOTE — Assessment & Plan Note (Signed)
Not clear if these are panic episodes or sleep disordered breathing.  He describes waking gasping and feeling tired in the daytime. Plan- schedule home sleep test

## 2022-04-16 DIAGNOSIS — G4733 Obstructive sleep apnea (adult) (pediatric): Secondary | ICD-10-CM | POA: Diagnosis not present

## 2022-04-18 ENCOUNTER — Telehealth: Payer: Self-pay | Admitting: Internal Medicine

## 2022-04-18 DIAGNOSIS — G479 Sleep disorder, unspecified: Secondary | ICD-10-CM

## 2022-04-18 NOTE — Telephone Encounter (Signed)
Pt. Calling to go over HST results

## 2022-04-18 NOTE — Telephone Encounter (Signed)
Dr. Annamaria Boots when you have a moment can you please advise on patients sleep study results

## 2022-04-19 NOTE — Telephone Encounter (Signed)
This sleep study did show obstructive sleep apnea, averaging 12 apneas/ hour.   I suggest we order new DME, new CPAP auto 5-15, mask of choice, humidifier, supplies, AirView/ card  He will need an ov for f/u in 31-90 days.

## 2022-04-21 NOTE — Telephone Encounter (Signed)
Went over sleep study results with patient. Order has been placed for patient to get a cpap machine.  Dr. Annamaria Boots patient would like to discuss results with you. Patient states he has several questions to ask. I advised patient you were unavailable until the end of the week.

## 2022-04-25 ENCOUNTER — Telehealth: Payer: Self-pay | Admitting: Internal Medicine

## 2022-04-25 NOTE — Telephone Encounter (Signed)
He had requested call to answer questions after sleep study. I left message to call office back. Triage can pass questions to me for response when able.

## 2022-04-30 NOTE — Telephone Encounter (Signed)
Left message for patient to call back  

## 2022-04-30 NOTE — Telephone Encounter (Signed)
Pt returning call. States he is confused by interpreting the results. He is requesting a general breakdown of what is found. He is concerned that a CPAP would be too expensive for him. Wants to know how warranted one would be for him based on the sleep study? Are there different kinds of CPAPs? Would like to know his options. Please advise.

## 2022-04-30 NOTE — Telephone Encounter (Signed)
Daniel Budge, MD      04/25/22  2:43 PM Note He had requested call to answer questions after sleep study. I left message to call office back. Triage can pass questions to me for response when able.       I tried calling the pt back and there was no answer- LMTCB. Need to find out what the questions he has are for Dr Maple Hudson.

## 2022-04-30 NOTE — Telephone Encounter (Signed)
Patient is returning phone call. Patient phone number is [phone].

## 2022-05-07 NOTE — Telephone Encounter (Signed)
Patient is calling again to get the results of his sleep study.  Please call patient to discuss at [phone]

## 2022-05-07 NOTE — Telephone Encounter (Signed)
Called and spoke with pt who stated he received a call from DME company about the order that was placed for a cpap start.  Pt does want a phone call from Dr. Maple Hudson to have the results further discussed by Dr. Maple Hudson. He wants to know if the results were severe enough to warrant that he really needs the cpap or if there are other options due to the cost of the cpap.  Dr. Maple Hudson, please advise. Pt can be reached at [phone].

## 2022-05-08 NOTE — Telephone Encounter (Signed)
Done he is coming 60

## 2022-05-08 NOTE — Telephone Encounter (Signed)
ATC X1 LVM for patient to call the office back. Please schedule with Dr. Maple Hudson next available. Dr. Maple Hudson has a 4:30 Monday April 22nd. Okay to use held spot

## 2022-05-08 NOTE — Telephone Encounter (Signed)
Please see if we can reschedule for Daniel Miller to come in sooner to discuss his Sleep study results and options for managing since he is concerned about cost of CPAP.

## 2022-05-11 NOTE — Progress Notes (Unsigned)
04/07/22- 41 yoM never smoker for sleep evaluation self-referred  Medical problem list includes Allergic Rhinitis, GERD, Dyspnea,  NPSG 04/18/21 ([facility]/Dr Athar) AHI 2.4/ hr, desaturation to 90%, body weight 164 lbs -Meds include- Ventolin hfa, Symbicort 80, Astelin, alprazolam, Epworth score-6 Body weight today-164 lbs Covid vaxx- Flu vax- -----Patient had sleep study in 2022, pt advises study was negative. But has episodes of waking up gasping for breath and sob. Wakes up within first few hours of sleep. Feels exhausted in the morning. Trial of inhalers for possible asthma made no difference. Describes waking suddenly, repeatedly in first few hours of sleep with trouble breathing. On 1 occasion wife noted he was gasping for breath as he sat up. Not associated with recalled dreams.No complex parasomnia. History of panic attacks. Xanax didn't help sleep. Asthma inhaler no help. Brother with OSA/ CPAP. No ENT surgery. Not aware of heart or lung problems.  1-2 cups AM coffee. Tired  05/12/22- 41 yoM never smoker followed for OSA, complicated by Allergic Rhinitis, GERD, Dyspnea,  NPSG 04/18/21 ([facility]/Dr Athar) AHI 2.4/ hr, desaturation to 90%, body weight 164 lbs HST 04/10/22- AHI 12/ hr (Complex 25 obst, 44 central, 7 mixed, 17 hypopneas), desaturation to 86%, body weight 164 lbs Body weight today  We ordered CPAP 5-15/ [facility] on 04/21/22 but he wanted to discuss. We reviewed his recent sleep study results with comparison to his neurology sleep study of 2023.  He focuses on daytime somnolence as a primary concern although he is aware from his wife that he snores.  I discussed treatment options and answered his questions.  He is deciding among choices but at this visit he was leaning towards trying CPAP.  Ultimately, he will have to decide if the treatment addresses his sleep complaints.   ROS-see HPI   + = positive Constitutional:    weight loss, night sweats, fevers, chills, fatigue, lassitude. HEENT:     headaches, difficulty swallowing, tooth/dental problems, sore throat,       sneezing, itching, ear ache, nasal congestion, post nasal drip, snoring CV:    chest pain, orthopnea, PND, swelling in lower extremities, anasarca,                                   dizziness, palpitations Resp:   shortness of breath with exertion or at rest.                productive cough,   non-productive cough, coughing up of blood.              change in color of mucus.  wheezing.   Skin:    rash or lesions. GI:  No-   heartburn, indigestion, abdominal pain, nausea, vomiting, diarrhea,                 change in bowel habits, loss of appetite GU: dysuria, change in color of urine, no urgency or frequency.   flank pain. MS:   joint pain, stiffness, decreased range of motion, back pain. Neuro-     nothing unusual Psych:  change in mood or affect.  depression or anxiety.   memory loss.  OBJ- Physical Exam General- Alert, Oriented, Affect-appropriate, Distress- none acute, + muscular,not obese Skin- rash-none, lesions- none, excoriation- none Lymphadenopathy- none Head- atraumatic            Eyes- Gross vision intact, PERRLA, conjunctivae and secretions clear  Ears- Hearing, canals-normal            Nose- Clear, no-Septal dev, mucus, polyps, erosion, perforation             Throat- Mallampati IV , mucosa clear , drainage- none, tonsils- atrophic, + teeth Neck- flexible , trachea midline, no stridor , thyroid nl, carotid no bruit Chest - symmetrical excursion , unlabored           Heart/CV- RRR , no murmur , no gallop  , no rub, nl s1 s2                           - JVD- none , edema- none, stasis changes- none, varices- none           Lung- clear to P&A, wheeze- none, cough- none , dullness-none, rub- none           Chest wall-  Abd-  Br/ Gen/ Rectal- Not done, not indicated Extrem- cyanosis- none, clubbing, none, atrophy- none, strength- nl Neuro- grossly intact to observation

## 2022-05-12 ENCOUNTER — Ambulatory Visit (INDEPENDENT_AMBULATORY_CARE_PROVIDER_SITE_OTHER): Payer: Commercial Managed Care - PPO | Admitting: Internal Medicine

## 2022-05-12 ENCOUNTER — Encounter: Payer: Self-pay | Admitting: Internal Medicine

## 2022-05-12 VITALS — BP 122/82 | HR 81 | Ht 68.0 in | Wt 164.4 lb

## 2022-05-12 DIAGNOSIS — J302 Other seasonal allergic rhinitis: Secondary | ICD-10-CM

## 2022-05-12 DIAGNOSIS — G4733 Obstructive sleep apnea (adult) (pediatric): Secondary | ICD-10-CM | POA: Diagnosis not present

## 2022-05-12 DIAGNOSIS — J3089 Other allergic rhinitis: Secondary | ICD-10-CM

## 2022-05-13 ENCOUNTER — Telehealth: Payer: Self-pay | Admitting: Internal Medicine

## 2022-05-13 ENCOUNTER — Encounter: Payer: Self-pay | Admitting: Internal Medicine

## 2022-05-13 NOTE — Assessment & Plan Note (Signed)
Mild rhinitis does not seem to be interfering with prospects for CPAP use.  OTC medications should be sufficient.

## 2022-05-13 NOTE — Telephone Encounter (Signed)
Patient states that [employer] is needing clinical notes stating why he needs a CPAP machine. [employer] stated that his "AHI" is just below 15 which is needed for CPAP. Patient gave [employer] fax number of [phone].   Please advise and call patient with an update.

## 2022-05-13 NOTE — Assessment & Plan Note (Signed)
I think he is leaning towards CPAP as initial therapy and we had already contacted [employer] to try auto 5-15.  He will follow-up with them.

## 2022-05-14 NOTE — Telephone Encounter (Signed)
Called and spoke with patient. He stated that he received a call from [employer] in regards to his cpap machine. He spoke with someone who stated that [employer] needed clinical notes to justify why he needed a cpap machine since his sleep study showed an AHI of less than 15. He could not remember the name of the person who called him.   I advised him that we usually send over a copy of all supporting notes when ordering a cpap machine and that I would follow up with [employer] in regards to this. He verbalized understanding.   I have sent a community message to [employer]. Will update once I receive a response.

## 2022-05-19 ENCOUNTER — Ambulatory Visit: Payer: Commercial Managed Care - PPO | Admitting: Internal Medicine

## 2022-05-20 DIAGNOSIS — R972 Elevated prostate specific antigen [PSA]: Secondary | ICD-10-CM | POA: Diagnosis not present

## 2022-05-30 DIAGNOSIS — G4733 Obstructive sleep apnea (adult) (pediatric): Secondary | ICD-10-CM | POA: Diagnosis not present

## 2022-06-04 DIAGNOSIS — K219 Gastro-esophageal reflux disease without esophagitis: Secondary | ICD-10-CM | POA: Diagnosis not present

## 2022-06-04 DIAGNOSIS — R109 Unspecified abdominal pain: Secondary | ICD-10-CM | POA: Diagnosis not present

## 2022-06-17 ENCOUNTER — Other Ambulatory Visit: Payer: Self-pay | Admitting: Cardiovascular Disease

## 2022-06-17 ENCOUNTER — Other Ambulatory Visit (HOSPITAL_COMMUNITY): Payer: Commercial Managed Care - PPO

## 2022-06-17 ENCOUNTER — Telehealth: Payer: Self-pay | Admitting: Cardiovascular Disease

## 2022-06-17 ENCOUNTER — Other Ambulatory Visit (HOSPITAL_BASED_OUTPATIENT_CLINIC_OR_DEPARTMENT_OTHER): Payer: Self-pay

## 2022-06-17 ENCOUNTER — Ambulatory Visit: Payer: Commercial Managed Care - PPO | Attending: Cardiovascular Disease | Admitting: Cardiovascular Disease

## 2022-06-17 ENCOUNTER — Encounter: Payer: Self-pay | Admitting: Cardiovascular Disease

## 2022-06-17 ENCOUNTER — Ambulatory Visit: Payer: Commercial Managed Care - PPO | Admitting: Cardiology

## 2022-06-17 ENCOUNTER — Ambulatory Visit (INDEPENDENT_AMBULATORY_CARE_PROVIDER_SITE_OTHER): Payer: Commercial Managed Care - PPO

## 2022-06-17 VITALS — BP 122/84 | HR 86 | Ht 68.0 in | Wt 162.0 lb

## 2022-06-17 DIAGNOSIS — R0609 Other forms of dyspnea: Secondary | ICD-10-CM

## 2022-06-17 DIAGNOSIS — E782 Mixed hyperlipidemia: Secondary | ICD-10-CM

## 2022-06-17 DIAGNOSIS — R0602 Shortness of breath: Secondary | ICD-10-CM

## 2022-06-17 DIAGNOSIS — E785 Hyperlipidemia, unspecified: Secondary | ICD-10-CM | POA: Insufficient documentation

## 2022-06-17 MED ORDER — PANTOPRAZOLE SODIUM 20 MG PO TBEC
20.0000 mg | DELAYED_RELEASE_TABLET | Freq: Every day | ORAL | 1 refills | Status: AC
Start: 2022-06-17 — End: ?
  Filled 2022-06-17: qty 90, 90d supply, fill #0

## 2022-06-17 NOTE — Assessment & Plan Note (Signed)
Daniel Miller  is self-referred because of episodic nocturnal shortness of breath.  He has been seen by Dr. Fannie Knee, pulm pulmonology, for obstructive sleep apnea on CPAP which he really does not tolerate nor can he tell any difference.  The symptoms began about 2-1/2 years ago.  He has mild dyspnea on exertion with activity during daytime hours however the big issue is waking up gasping for breath in the middle of the night for no clear reason.  His wife says he snores occasionally.  His symptoms sound somewhat like orthopnea.  When he sleeps with his head elevated the symptoms do not change however.  Does have a history of anxiety.  I am going to get a routine GXT, 2D echo and Twohig Zio patch and see him back after that for further evaluation.

## 2022-06-17 NOTE — Assessment & Plan Note (Signed)
History of hyperlipidemia not on statin therapy with his most recent lipid profile performed 02/10/2022 revealing total cholesterol 185, LDL 120 and HDL 42, not at goal for primary prevention.

## 2022-06-17 NOTE — Patient Instructions (Addendum)
Medication Instructions:  Your physician recommends that you continue on your current medications as directed. Please refer to the Current Medication list given to you today.  *If you need a refill on your cardiac medications before your next appointment, please call your pharmacy*   Testing/Procedures: Your physician has requested that you have an exercise tolerance test.  Please also follow instruction sheet, as given. This will take place at [address] Do not drink or eat foods with caffeine for 24 hours before the test. (Chocolate, coffee, tea, or energy drinks) If you use an inhaler, bring it with you to the test. Do not smoke for 4 hours before the test. Wear comfortable shoes and clothing.    Your physician has requested that you have an echocardiogram. Echocardiography is a painless test that uses sound waves to create images of your heart. It provides your doctor with information about the size and shape of your heart and how well your heart's chambers and valves are working. This procedure takes approximately one hour. There are no restrictions for this procedure. Please do NOT wear cologne, perfume, aftershave, or lotions (deodorant is allowed). Please arrive 15 minutes prior to your appointment time. This will take place at [address]   ZIO XT- Long Term Monitor Instructions  Your physician has requested you wear a ZIO patch monitor for 14 days.  This is a single patch monitor. [employer] supplies one patch monitor per enrollment. Additional stickers are not available. Please do not apply patch if you will be having a Nuclear Stress Test,  Echocardiogram, Cardiac CT, MRI, or Chest Xray during the period you would be wearing the  monitor. The patch cannot be worn during these tests. You cannot remove and re-apply the  ZIO XT patch monitor.  Your ZIO patch monitor will be mailed 3 day USPS to your address on file. It may take 3-5 days  to receive your  monitor after you have been enrolled.  Once you have received your monitor, please review the enclosed instructions. Your monitor  has already been registered assigning a specific monitor serial # to you.  Billing and Patient Assistance Program Information  We have supplied [employer] with any of your insurance information on file for billing purposes. [employer] offers a sliding scale Patient Assistance Program for patients that do not have  insurance, or whose insurance does not completely cover the cost of the ZIO monitor.  You must apply for the Patient Assistance Program to qualify for this discounted rate.  To apply, please call [employer] at [phone], select option 4, select option 2, ask to apply for  Patient Assistance Program. Meredeth Ide will ask your household income, and how many people  are in your household. They will quote your out-of-pocket cost based on that information.  [employer] will also be able to set up a 65-month, interest-free payment plan if needed.  Applying the monitor   Shave hair from upper left chest.  Hold abrader disc by orange tab. Rub abrader in 40 strokes over the upper left chest as  indicated in your monitor instructions.  Clean area with 4 enclosed alcohol pads. Let dry.  Apply patch as indicated in monitor instructions. Patch will be placed under collarbone on left  side of chest with arrow pointing upward.  Rub patch adhesive wings for 2 minutes. Remove white label marked "1". Remove the white  label marked "2". Rub patch adhesive wings for 2 additional minutes.  While looking  in a mirror, press and release button in center of patch. A small green light will  flash 3-4 times. This will be your only indicator that the monitor has been turned on.  Do not shower for the first 24 hours. You may shower after the first 24 hours.  Press the button if you feel a symptom. You will hear a small click. Record Date, Time and  Symptom in the Patient Logbook.  When you  are ready to remove the patch, follow instructions on the last 2 pages of Patient  Logbook. Stick patch monitor onto the last page of Patient Logbook.  Place Patient Logbook in the blue and white box. Use locking tab on box and tape box closed  securely. The blue and white box has prepaid postage on it. Please place it in the mailbox as  soon as possible. Your physician should have your test results approximately 7 days after the  monitor has been mailed back to [facility].  Call [facility] Customer Care at [phone] if you have questions regarding  your ZIO XT patch monitor. Call them immediately if you see an orange light blinking on your  monitor.  If your monitor falls off in less than 4 days, contact our Monitor department at [phone].  If your monitor becomes loose or falls off after 4 days call [employer] at [phone] for  suggestions on securing your monitor   Follow-Up: At [facility], you and your health needs are our priority.  As part of our continuing mission to provide you with exceptional heart care, we have created designated Provider Care Teams.  These Care Teams include your primary Cardiologist (physician) and Advanced Practice Providers (APPs -  Physician Assistants and Nurse Practitioners) who all work together to provide you with the care you need, when you need it.  We recommend signing up for the patient portal called "MyChart".  Sign up information is provided on this After Visit Summary.  MyChart is used to connect with patients for Virtual Visits (Telemedicine).  Patients are able to view lab/test results, encounter notes, upcoming appointments, etc.  Non-urgent messages can be sent to your provider as well.   To learn more about what you can do with MyChart, go to [email or web address].    Your next appointment:   We will see you on an as needed basis.  Provider:   Nanetta Batty, MD

## 2022-06-17 NOTE — Progress Notes (Signed)
06/17/2022 Daniel Miller   1980/03/24  [id number]  Primary Physician Daisy Floro, MD Primary Cardiologist: Runell Gess MD Nicholes Calamity, [place]  HPI:  Daniel Miller is a 42 y.o. 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  His only cardiac risk factor is mild hyperlipidemia.  Is never had heart attack or stroke.  He does have GERD and has been evaluated by GI on antireflux medication as well as mild obstructive sleep apnea followed by Dr. Maple Hudson.  He is fairly active and has minimal shortness of breath during daytime hours.  Over the last 2 years he is noted symptoms of waking up gasping in the middle the night for no clear reason.   Current Meds  Medication Sig   ALPRAZolam (XANAX) 0.25 MG tablet Take 0.5-1 tablets (0.125-0.25 mg total) by mouth daily as needed.   pantoprazole (PROTONIX) 20 MG tablet Take 1 tablet (20 mg total) by mouth daily for 90 days.     No Known Allergies  Social History   Socioeconomic History   Marital status: Married    Spouse name: Not on file   Number of children: Not on file   Years of education: Not on file   Highest education level: Not on file  Occupational History   Not on file  Tobacco Use   Smoking status: Never   Smokeless tobacco: Never  Substance and Sexual Activity   Alcohol use: Not on file   Drug use: Not on file   Sexual activity: Not on file  Other Topics Concern   Not on file  Social History Narrative   Not on file   Social Determinants of Health   Financial Resource Strain: Not on file  Food Insecurity: Not on file  Transportation Needs: Not on file  Physical Activity: Not on file  Stress: Not on file  Social Connections: Not on file  Intimate Partner Violence: Not on file     Review of Systems: General: negative for chills, fever, night sweats or weight changes.  Cardiovascular: negative for chest pain, dyspnea on exertion, edema,  orthopnea, palpitations, paroxysmal nocturnal dyspnea or shortness of breath Dermatological: negative for rash Respiratory: negative for cough or wheezing Urologic: negative for hematuria Abdominal: negative for nausea, vomiting, diarrhea, bright red blood per rectum, melena, or hematemesis Neurologic: negative for visual changes, syncope, or dizziness All other systems reviewed and are otherwise negative except as noted above.    Blood pressure 122/84, pulse 86, height 5\' 8"  (1.727 m), weight 162 lb (73.5 kg), SpO2 97 %.  General appearance: alert and no distress Neck: no adenopathy, no carotid bruit, no JVD, supple, symmetrical, trachea midline, and thyroid not enlarged, symmetric, no tenderness/mass/nodules Lungs: clear to auscultation bilaterally Heart: regular rate and rhythm, S1, S2 normal, no murmur, click, rub or gallop Extremities: extremities normal, atraumatic, no cyanosis or edema Pulses: 2+ and symmetric Skin: Skin color, texture, turgor normal. No rashes or lesions Neurologic: Grossly normal  EKG sinus rhythm at 86 with borderline criteria for LVH.  I personally reviewed this EKG.  ASSESSMENT AND PLAN:   Shortness of breath [name]  is self-referred because of episodic nocturnal shortness of breath.  He has been seen by Dr. Fannie Knee, pulm pulmonology, for obstructive sleep apnea on CPAP which he really does not tolerate nor can he tell any difference.  The symptoms began about 2-1/2 years ago.  He has mild dyspnea on exertion with activity during daytime hours however the big issue is waking up gasping for breath in the middle of the night for no clear reason.  His wife says he snores occasionally.  His symptoms sound somewhat like orthopnea.  When he sleeps with his head elevated the symptoms do not change however.  Does have a history of anxiety.  I am going to get a routine GXT, 2D echo and [place] Zio patch and see him back after that for further  evaluation.  Hyperlipidemia History of hyperlipidemia not on statin therapy with his most recent lipid profile performed 02/10/2022 revealing total cholesterol 185, LDL 120 and HDL 42, not at goal for primary prevention.     Runell Gess MD FACP,FACC,FAHA, [facility] 06/17/2022 11:28 AM

## 2022-06-17 NOTE — Telephone Encounter (Signed)
Pt called and has question about about the monitor.  He wants to know if there is anyway he can pick it up at the office tomorrow since he is coming for a echo.  Pt work for [employer], they were contracted out and there new ins starts soon and he is trying to get all of this done for the new ins go in effect,  he has some question about the time frame of when the charges is drop and when it will get insurance   Best number [phone]

## 2022-06-17 NOTE — Progress Notes (Unsigned)
Enrolled for [employer] to mail a ZIO XT long term holter monitor to the patients address on file.  06/18/22 monitor serial # [id number] given to patient from office inventory because shipped monitor would not arrive in time. Patient will return unused shipped monitor to [facility] $396. Self pay from [facility]. Quoted $50 out of pocket from [place] for interpretation.

## 2022-06-18 ENCOUNTER — Ambulatory Visit (HOSPITAL_COMMUNITY): Payer: Commercial Managed Care - PPO | Attending: Cardiovascular Disease

## 2022-06-18 ENCOUNTER — Other Ambulatory Visit (HOSPITAL_BASED_OUTPATIENT_CLINIC_OR_DEPARTMENT_OTHER): Payer: Self-pay

## 2022-06-18 DIAGNOSIS — E782 Mixed hyperlipidemia: Secondary | ICD-10-CM | POA: Diagnosis not present

## 2022-06-18 DIAGNOSIS — R0602 Shortness of breath: Secondary | ICD-10-CM | POA: Diagnosis not present

## 2022-06-18 LAB — ECHOCARDIOGRAM COMPLETE
Area-P 1/2: 3.68 cm2
S' Lateral: 3.5 cm

## 2022-06-18 MED ORDER — ALPRAZOLAM 0.25 MG PO TABS
ORAL_TABLET | ORAL | 0 refills | Status: AC
  Filled 2022-06-18: qty 30, 30d supply, fill #0

## 2022-06-18 NOTE — Telephone Encounter (Signed)
LDM on answering machine per DPR with instructions about his ETT scheduled on 06/20/22 at 8:15.

## 2022-06-19 ENCOUNTER — Encounter (HOSPITAL_COMMUNITY): Payer: Commercial Managed Care - PPO

## 2022-06-20 ENCOUNTER — Encounter (HOSPITAL_COMMUNITY): Payer: Commercial Managed Care - PPO

## 2022-06-20 ENCOUNTER — Ambulatory Visit (HOSPITAL_COMMUNITY): Payer: Commercial Managed Care - PPO | Attending: Cardiology

## 2022-06-20 DIAGNOSIS — E782 Mixed hyperlipidemia: Secondary | ICD-10-CM

## 2022-06-20 DIAGNOSIS — R0602 Shortness of breath: Secondary | ICD-10-CM | POA: Diagnosis not present

## 2022-06-20 DIAGNOSIS — R0609 Other forms of dyspnea: Secondary | ICD-10-CM | POA: Diagnosis not present

## 2022-06-20 LAB — EXERCISE TOLERANCE TEST
Angina Index: 0
Duke Treadmill Score: 11
Estimated workload: 13.4
Exercise duration (min): 11 min
Exercise duration (sec): 0 s
MPHR: 180 {beats}/min
Peak HR: 179 {beats}/min
Percent HR: 99 %
Rest HR: 73 {beats}/min
ST Depression (mm): 0 mm

## 2022-06-24 ENCOUNTER — Encounter: Payer: Self-pay | Admitting: Internal Medicine

## 2022-06-25 NOTE — Telephone Encounter (Signed)
Order- DME [facility]  please DC CPAP

## 2022-06-25 NOTE — Telephone Encounter (Signed)
Mychart message sent by pt:  [name] [facility] (supporting Waymon Budge, MD)23 hours ago (12:11 PM)   This message is for Dr. Maple Hudson or his Nurse. After a couple of weeks of using my CPAP, it has not helped and in fact has made my sleep worse. The company who provided my C PAP said that I have 30 days to return my machine for no charge. I would like to move forward with returning the machine. [facility] requested that I have Dr. Maple Hudson submit a discontinue prescription for the C PAP machine. Can you please submit this as soon as possible so I can return the device? If you have any questions, please call me at [phone].   Thank you, Daniel Miller   Dr. Maple Hudson, please advise.

## 2022-06-27 ENCOUNTER — Other Ambulatory Visit: Payer: Self-pay

## 2022-06-27 DIAGNOSIS — G4733 Obstructive sleep apnea (adult) (pediatric): Secondary | ICD-10-CM

## 2022-07-28 ENCOUNTER — Ambulatory Visit: Payer: Commercial Managed Care - PPO | Admitting: Internal Medicine

## 2022-08-09 NOTE — Progress Notes (Deleted)
HPI  M never smoker followed for OSA, complicated by Allergic Rhinitis, GERD, Dyspnea,  NPSG 04/18/21 ([facility]/Dr Athar) AHI 2.4/ hr, desaturation to 90%, body weight 164 lbs HST 04/10/22- AHI 12/ hr (Complex 25 obst, 44 central, 7 mixed, 17 hypopneas), desaturation to 86%, body weight 164 lbs  ===========================================================  05/12/22- 41 yoM never smoker followed for OSA, complicated by Allergic Rhinitis, GERD, Dyspnea,  NPSG 04/18/21 ([facility]/Dr Athar) AHI 2.4/ hr, desaturation to 90%, body weight 164 lbs HST 04/10/22- AHI 12/ hr (Complex 25 obst, 44 central, 7 mixed, 17 hypopneas), desaturation to 86%, body weight 164 lbs Body weight today  We ordered CPAP 5-15/ [facility] on 04/21/22 but he wanted to discuss. We reviewed his recent sleep study results with comparison to his neurology sleep study of 2023.  He focuses on daytime somnolence as a primary concern although he is aware from his wife that he snores.  I discussed treatment options and answered his questions.  He is deciding among choices but at this visit he was leaning towards trying CPAP.  Ultimately, he will have to decide if the treatment addresses his sleep complaints.   08/12/22- 42 yoM never smoker followed for OSA, complicated by Allergic Rhinitis, GERD, Dyspnea,  CPAP ordered 04/21/22 [facility] 5-15    CPAP dc'd 06/27/22 patient request- did not help sleep.   ROS-see HPI   + = positive Constitutional:    weight loss, night sweats, fevers, chills, fatigue, lassitude. HEENT:    headaches, difficulty swallowing, tooth/dental problems, sore throat,       sneezing, itching, ear ache, nasal congestion, post nasal drip, snoring CV:    chest pain, orthopnea, PND, swelling in lower extremities, anasarca,                                   dizziness, palpitations Resp:   shortness of breath with exertion or at rest.                productive cough,   non-productive cough, coughing up of blood.              change in  color of mucus.  wheezing.   Skin:    rash or lesions. GI:  No-   heartburn, indigestion, abdominal pain, nausea, vomiting, diarrhea,                 change in bowel habits, loss of appetite GU: dysuria, change in color of urine, no urgency or frequency.   flank pain. MS:   joint pain, stiffness, decreased range of motion, back pain. Neuro-     nothing unusual Psych:  change in mood or affect.  depression or anxiety.   memory loss.  OBJ- Physical Exam General- Alert, Oriented, Affect-appropriate, Distress- none acute, + muscular,not obese Skin- rash-none, lesions- none, excoriation- none Lymphadenopathy- none Head- atraumatic            Eyes- Gross vision intact, PERRLA, conjunctivae and secretions clear            Ears- Hearing, canals-normal            Nose- Clear, no-Septal dev, mucus, polyps, erosion, perforation             Throat- Mallampati IV , mucosa clear , drainage- none, tonsils- atrophic, + teeth Neck- flexible , trachea midline, no stridor , thyroid nl, carotid no bruit Chest - symmetrical excursion , unlabored  Heart/CV- RRR , no murmur , no gallop  , no rub, nl s1 s2                           - JVD- none , edema- none, stasis changes- none, varices- none           Lung- clear to P&A, wheeze- none, cough- none , dullness-none, rub- none           Chest wall-  Abd-  Br/ Gen/ Rectal- Not done, not indicated Extrem- cyanosis- none, clubbing, none, atrophy- none, strength- nl Neuro- grossly intact to observation

## 2022-08-12 ENCOUNTER — Ambulatory Visit: Payer: Commercial Managed Care - PPO | Admitting: Internal Medicine

## 2022-08-12 ENCOUNTER — Telehealth: Payer: Self-pay | Admitting: Internal Medicine

## 2022-08-12 NOTE — Telephone Encounter (Signed)
Pt states he was put on Cpap machine but Cpap machine isnt working. Pt is having sleep apnea issues and said he was told by Dr. Maple Hudson that he can be seen earlier to just reach out to him

## 2022-08-20 NOTE — Telephone Encounter (Signed)
Dr. Maple Hudson, your next opening is 9/10- can a held slot be used for Mr. Asplund?

## 2022-08-20 NOTE — Telephone Encounter (Signed)
ATC X1 LVM for patient Please schedule in next available held spot per Dr.Young. Also advise patient he can try contacting [employer] to service machine if there seems to be issues.

## 2022-08-20 NOTE — Telephone Encounter (Signed)
Per chart patient has canceled 3 scheduled appointments since LOV.   Patient can be seen at next available OV w/Young.

## 2022-08-20 NOTE — Telephone Encounter (Signed)
Ok to use held spot 

## 2022-08-22 NOTE — Telephone Encounter (Signed)
ATC X1 LVM for patient Also sent my chart message, so closing current encounter

## 2022-09-06 NOTE — Progress Notes (Signed)
HPI 50 yoM never smoker followed for OSA, complicated by Allergic Rhinitis, GERD, Dyspnea,  NPSG 04/18/21 ([facility]/Dr Athar) AHI 2.4/ hr, desaturation to 90%, body weight 164 lbs HST 04/10/22- AHI 12/ hr (Complex 25 obst, 44 central, 7 mixed, 17 hypopneas), desaturation to 86%, body weight 164 lbs Body weight today  We ordered CPAP 5-15/ [facility] on 04/21/22 but he wanted to discuss.  ========================================================= 05/12/22- 41 yoM never smoker followed for OSA, complicated by Allergic Rhinitis, GERD, Dyspnea,  NPSG 04/18/21 ([facility]/Dr Athar) AHI 2.4/ hr, desaturation to 90%, body weight 164 lbs HST 04/10/22- AHI 12/ hr (Complex 25 obst, 44 central, 7 mixed, 17 hypopneas), desaturation to 86%, body weight 164 lbs Body weight today  We ordered CPAP 5-15/ [facility] on 04/21/22 but he wanted to discuss. We reviewed his recent sleep study results with comparison to his neurology sleep study of 2023.  He focuses on daytime somnolence as a primary concern although he is aware from his wife that he snores.  I discussed treatment options and answered his questions.  He is deciding among choices but at this visit he was leaning towards trying CPAP.  Ultimately, he will have to decide if the treatment addresses his sleep complaints.  09/08/22- 42 yoM never smoker followed for Complex Sleep Apnea, Somnolence, complicated by Allergic Rhinitis, GERD, Dyspnea, Anxiety,  CPAP/ [facility]/ [facility] ordered dc'd at his request 06/27/22 -Alprazolam 0.25 mg,  Body weight today-166 lbs He tried CPAP brieifly and turned it back in, saying he is convinced his problem is not sleep apnea. Episodes of chest tightness- needs to sigh. He tried antihistamine zyrtec thinking it was allergy. Saw allergist- nl spirometry.Then saw cardiologist with normal ECHO, EKG and stress test. He says these concerns, centering around sleep, started around time of Covid infection in 2021. Wife tells him he snores some. He seems fixated on  pretty vague symptoms without objective findings. He does understand he tends to be anxious We will make a last check with PFT and CXR. Beyond that I may not have anything to offer.  ROS-see HPI   + = positive Constitutional:    weight loss, night sweats, fevers, chills, fatigue, lassitude. HEENT:    headaches, difficulty swallowing, tooth/dental problems, sore throat,       sneezing, itching, ear ache, nasal congestion, post nasal drip, snoring CV:    chest pain, orthopnea, PND, swelling in lower extremities, anasarca,                                   dizziness, palpitations Resp:   shortness of breath with exertion or at rest.                productive cough,   non-productive cough, coughing up of blood.              change in color of mucus.  wheezing.   Skin:    rash or lesions. GI:  No-   heartburn, indigestion, abdominal pain, nausea, vomiting, diarrhea,                 change in bowel habits, loss of appetite GU: dysuria, change in color of urine, no urgency or frequency.   flank pain. MS:   joint pain, stiffness, decreased range of motion, back pain. Neuro-     nothing unusual Psych:  change in mood or affect.  depression or anxiety.   memory loss.  OBJ- Physical  Exam General- Alert, Oriented, Affect-appropriate, Distress- none acute, + muscular,not obese Skin- rash-none, lesions- none, excoriation- none Lymphadenopathy- none Head- atraumatic            Eyes- Gross vision intact, PERRLA, conjunctivae and secretions clear            Ears- Hearing, canals-normal            Nose- Clear, no-Septal dev, mucus, polyps, erosion, perforation             Throat- Mallampati IV , mucosa clear , drainage- none, tonsils- atrophic, + teeth Neck- flexible , trachea midline, no stridor , thyroid nl, carotid no bruit Chest - symmetrical excursion , unlabored           Heart/CV- RRR , no murmur , no gallop  , no rub, nl s1 s2                           - JVD- none , edema- none, stasis changes-  none, varices- none           Lung- clear to P&A, wheeze- none, cough- none , dullness-none, rub- none           Chest wall-  Abd-  Br/ Gen/ Rectal- Not done, not indicated Extrem- cyanosis- none, clubbing, none, atrophy- none, strength- nl Neuro- grossly intact to observation

## 2022-09-08 ENCOUNTER — Encounter: Payer: Self-pay | Admitting: Internal Medicine

## 2022-09-08 ENCOUNTER — Ambulatory Visit (INDEPENDENT_AMBULATORY_CARE_PROVIDER_SITE_OTHER): Payer: Managed Care, Other (non HMO) | Admitting: Internal Medicine

## 2022-09-08 ENCOUNTER — Ambulatory Visit: Payer: Managed Care, Other (non HMO)

## 2022-09-08 VITALS — BP 118/72 | HR 77 | Temp 97.4°F | Ht 68.0 in | Wt 166.8 lb

## 2022-09-08 DIAGNOSIS — R0602 Shortness of breath: Secondary | ICD-10-CM | POA: Diagnosis not present

## 2022-09-08 DIAGNOSIS — G4733 Obstructive sleep apnea (adult) (pediatric): Secondary | ICD-10-CM | POA: Diagnosis not present

## 2022-09-08 DIAGNOSIS — R0609 Other forms of dyspnea: Secondary | ICD-10-CM

## 2022-09-08 NOTE — Patient Instructions (Signed)
Order- Schedule PFT     dx Dyspnea on exertion   Order- CXR  dx Dyspnea on exertion- future- may do when returns for PFT

## 2022-09-12 NOTE — Assessment & Plan Note (Signed)
Plan- PFT, CXR

## 2022-09-12 NOTE — Assessment & Plan Note (Signed)
He tried CPAP and turned it back in, saying his problem is not sleep apnea.

## 2022-11-18 ENCOUNTER — Ambulatory Visit: Payer: Managed Care, Other (non HMO) | Admitting: Internal Medicine

## 2022-11-18 DIAGNOSIS — R0609 Other forms of dyspnea: Secondary | ICD-10-CM

## 2022-11-18 LAB — PULMONARY FUNCTION TEST
DL/VA % pred: 97 %
DL/VA: 4.57 ml/min/mmHg/L
DLCO cor % pred: 117 %
DLCO cor: 34 ml/min/mmHg
DLCO unc % pred: 117 %
DLCO unc: 34 ml/min/mmHg
FEF 25-75 Post: 4.69 L/s
FEF 25-75 Pre: 4.51 L/s
FEF2575-%Change-Post: 4 %
FEF2575-%Pred-Post: 125 %
FEF2575-%Pred-Pre: 120 %
FEV1-%Change-Post: 0 %
FEV1-%Pred-Post: 124 %
FEV1-%Pred-Pre: 123 %
FEV1-Post: 4.9 L
FEV1-Pre: 4.86 L
FEV1FVC-%Change-Post: 2 %
FEV1FVC-%Pred-Pre: 99 %
FEV6-%Change-Post: -1 %
FEV6-%Pred-Post: 125 %
FEV6-%Pred-Pre: 127 %
FEV6-Post: 6.04 L
FEV6-Pre: 6.15 L
FEV6FVC-%Pred-Post: 102 %
FEV6FVC-%Pred-Pre: 102 %
FVC-%Change-Post: -2 %
FVC-%Pred-Post: 122 %
FVC-%Pred-Pre: 124 %
FVC-Post: 6.04 L
FVC-Pre: 6.16 L
Post FEV1/FVC ratio: 81 %
Post FEV6/FVC ratio: 100 %
Pre FEV1/FVC ratio: 79 %
Pre FEV6/FVC Ratio: 100 %
RV % pred: 23 %
RV: 0.41 L
TLC % pred: 97 %
TLC: 6.39 L

## 2022-11-18 NOTE — Patient Instructions (Signed)
Full PFT performed today. °

## 2022-11-18 NOTE — Progress Notes (Signed)
Full PFT performed today. °

## 2022-11-20 ENCOUNTER — Ambulatory Visit: Payer: Managed Care, Other (non HMO)

## 2022-12-01 ENCOUNTER — Encounter: Payer: Self-pay | Admitting: Internal Medicine

## 2023-01-06 ENCOUNTER — Other Ambulatory Visit (HOSPITAL_BASED_OUTPATIENT_CLINIC_OR_DEPARTMENT_OTHER): Payer: Self-pay

## 2023-01-06 MED ORDER — PANTOPRAZOLE SODIUM 40 MG PO TBEC
40.0000 mg | DELAYED_RELEASE_TABLET | ORAL | 0 refills | Status: AC
  Filled 2023-01-06: qty 90, 90d supply, fill #0

## 2023-02-13 ENCOUNTER — Other Ambulatory Visit (HOSPITAL_BASED_OUTPATIENT_CLINIC_OR_DEPARTMENT_OTHER): Payer: Self-pay

## 2023-02-13 MED ORDER — ALPRAZOLAM 0.25 MG PO TABS
0.1250 mg | ORAL_TABLET | Freq: Every day | ORAL | 0 refills | Status: AC | PRN
  Filled 2023-02-13: qty 30, 30d supply, fill #0

## 2023-02-17 ENCOUNTER — Other Ambulatory Visit (HOSPITAL_BASED_OUTPATIENT_CLINIC_OR_DEPARTMENT_OTHER): Payer: Self-pay

## 2023-02-17 MED ORDER — DOXYCYCLINE MONOHYDRATE 100 MG PO TABS
100.0000 mg | ORAL_TABLET | Freq: Two times a day (BID) | ORAL | 0 refills | Status: AC
  Filled 2023-02-17: qty 28, 14d supply, fill #0

## 2023-02-17 MED ORDER — GENTAMICIN SULFATE 0.1 % EX OINT
TOPICAL_OINTMENT | CUTANEOUS | 3 refills | Status: AC
  Filled 2023-02-17: qty 30, 30d supply, fill #0

## 2023-02-18 ENCOUNTER — Other Ambulatory Visit (HOSPITAL_BASED_OUTPATIENT_CLINIC_OR_DEPARTMENT_OTHER): Payer: Self-pay

## 2023-02-18 ENCOUNTER — Other Ambulatory Visit: Payer: Self-pay

## 2023-04-16 ENCOUNTER — Ambulatory Visit: Admitting: Psychology

## 2023-04-16 DIAGNOSIS — F411 Generalized anxiety disorder: Secondary | ICD-10-CM

## 2023-04-16 NOTE — Progress Notes (Signed)
 [facility] Counselor Initial Adult Exam  Name: Daniel Miller Date: 04/16/2023 MRN: [id number] DOB: 19-Oct-1980 PCP: Daisy Floro, MD  Time spent: 60 mins     start time: 1300     end time: 1400  Guardian/Payee:  Pt   Paperwork requested: No   Reason for Visit /Presenting Problem: Pt presents in person for the session, granting consent for the session as well.    Mental Status Exam: Appearance:   Casual     Behavior:  Appropriate  Motor:  Normal  Speech/Language:   Clear and Coherent  Affect:  Appropriate  Mood:  normal  Thought process:  normal  Thought content:    WNL  Sensory/Perceptual disturbances:    WNL  Orientation:  oriented to person, place, and time/date  Attention:  Good  Concentration:  Good  Memory:  WNL  Fund of knowledge:   Good  Insight:    Good  Judgment:   Good  Impulse Control:  Good   Reported Symptoms:  Pt shares that he has lived with anxiety most of his life; he shares that in 2012 he had his first panic attack; he has Xanax but does not take it frequently.  His anxiety continues to make him irritable with the kids and Corrie Dandy; pt takes responsibility for everything in his life.  Pt uses planning to help reduce his stresses; time management benefits him as well.  He is less stressed on the weekends "because I can shut it off then."  Pt works from home.    Risk Assessment: Danger to Self:  No Self-injurious Behavior: No Danger to Others: No Duty to Warn:no Physical Aggression / Violence:No  Access to Firearms a concern: No  Gang Involvement:No  Patient / guardian was educated about steps to take if suicide or homicide risk level increases between visits: n/a While future psychiatric events cannot be accurately predicted, the patient does not currently require acute inpatient psychiatric care and does not currently meet [facility] involuntary commitment criteria.  Substance Abuse History: Current substance abuse: No ; social use  of alcohol  Past Psychiatric History:   Previous psychological history is significant for anxiety Outpatient Providers:One outpt therapy visit History of Psych Hospitalization: No  Psychological Testing:  none    Abuse History:  Victim of: No.,  no    Report needed: No. Victim of Neglect:No. Perpetrator of  none   Witness / Exposure to Domestic Violence: No   Protective Services Involvement: No  Witness to [employer] Violence:  No   Family History: reviewed  Living situation: the patient lives with their family  Sexual Orientation: Straight  Relationship Status: married  Name of spouse / other: Corrie Dandy; married for 14 yrs; met in college If a parent, number of children / ages:10 yo daughter Charlotte Sanes; 75 yo son Mosetta Putt  Support Systems: spouse friends  Surveyor, quantity Stress:  No   Income/Employment/Disability: Employment; pt works in Consulting civil engineer for [employer] and 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 History of legal issue / charges:  none  Medical History/Surgical History: reviewed  Medications: Current Outpatient Medications  Medication Sig Dispense Refill   ALPRAZolam (XANAX) 0.25 MG tablet Take 0.5-1 tablets (0.125-0.25 mg total) by mouth daily as needed. (Patient not taking: Reported on 09/08/2022) 30 tablet 0   ALPRAZolam (XANAX) 0.25 MG tablet Take 1/2-1 tablet by mouth  Once a day if needed 30 tablet 0   ALPRAZolam (XANAX) 0.25 MG tablet Take 0.5-1 tablets (0.125-0.25 mg total) by mouth daily as needed. 30 tablet 0   doxycycline (ADOXA) 100 MG tablet Take 1 tablet (100 mg total) by mouth 2 (two) times daily. 28 tablet 0   gentamicin ointment (GARAMYCIN) 0.1 % Apply a small amount to affected nostril(s) 3 times daily for 14 days 30 g 3   pantoprazole (PROTONIX) 20 MG tablet Take 1 tablet (20 mg total) by mouth daily for 90 days. 90 tablet 1   pantoprazole (PROTONIX) 40 MG tablet Take 1 tablet (40 mg total) by mouth every morning. Take 30 minutes to 1 hour prior to meal. 90 tablet 0   No current facility-administered medications for this visit.    No Known Allergies  Diagnoses:  Generalized anxiety disorder  Plan of Care: Encouraged pt to think about how his anxious episodes have impacted him and his family and we will meet in 4 wks to discusss (due to my vacation) 05/19/23.   Karie Kirks, [facility]

## 2023-05-19 ENCOUNTER — Ambulatory Visit (INDEPENDENT_AMBULATORY_CARE_PROVIDER_SITE_OTHER): Admitting: Psychology

## 2023-05-19 DIAGNOSIS — F411 Generalized anxiety disorder: Secondary | ICD-10-CM

## 2023-05-19 NOTE — Progress Notes (Signed)
 [facility] Counselor/Therapist Progress Note  Patient ID: Daniel Miller, MRN: [id number],    Date: 05/19/2023  Time Spent: 50 mins   start time: 1305    end time:   Treatment Type: Individual Therapy  Reported Symptoms: Pt presents in person for session in the office, granting consent for the session.    Mental Status Exam: Appearance:  Casual     Behavior: Appropriate  Motor: Normal  Speech/Language:  Clear and Coherent  Affect: Appropriate  Mood: normal  Thought process: normal  Thought content:   WNL  Sensory/Perceptual disturbances:   WNL  Orientation: oriented to person, place, and time/date  Attention: Good  Concentration: Good  Memory: WNL  Fund of knowledge:  Good  Insight:   Good  Judgment:  Good  Impulse Control: Good   Risk Assessment: Danger to Self:  No Self-injurious Behavior: No Danger to Others: No Duty to Warn:no Physical Aggression / Violence:No  Access to Firearms a concern: No  Gang Involvement:No   Subjective: Pt shares that his parents are still here, leaving tomorrow.  He has accepted a new position with [employer] and he has worked for them before; he has a good friend who already works in [employer].  His grandmother passed away last week while his parents were here.  Pt is glad his parents came to visit and he will be glad for them to head back home.  Pt shares he did reflect on how his anxiety impacts him and those around him.  He feels overwhelmed with his anxiety, he gets grumpy with the kids, etc.  Pt wants to work on managing his emotions in responses to his kids.  Encouraged pt to continue with his self care activities and we will meet in 2 wks for a follow up session.  Interventions: Cognitive Behavioral Therapy  Diagnosis:Generalized anxiety disorder  Plan: Treatment Plan Strengths/Abilities:  Intelligent, Intuitive, Willing to participate in therapy Treatment Preferences:  Outpatient Individual Therapy Statement of  Needs:  Patient is to use CBT, mindfulness and coping skills to help manage and/or decrease symptoms associated with their diagnosis. Symptoms:  Depressed/Irritable mood, worry, social withdrawal Problems Addressed:  Depressive thoughts, Sadness, Sleep issues, etc. Long Term Goals:  Pt to reduce overall level, frequency, and intensity of the feelings of depression/anxiety as evidenced by decreased irritability, negative self talk, and helpless feelings from 6 to 7 days/week to 0 to 1 days/week, per client report, for at least 3 consecutive months.  Progress:  Short Term Goals:  Pt to verbally express understanding of the relationship between feelings of depression/anxiety and their impact on thinking patterns and behaviors.  Pt to verbalize an understanding of the role that distorted thinking plays in creating fears, excessive worry, and ruminations.  Progress:  Target Date:  05/18/2024 Frequency:  Bi-weekly Modality:  Cognitive Behavioral Therapy Interventions by Therapist:  Therapist will use CBT, Mindfulness exercises, Coping skills and Referrals, as needed by client. Client has verbally approved this treatment plan.  Daniel Miller, [facility]

## 2023-06-09 ENCOUNTER — Ambulatory Visit (INDEPENDENT_AMBULATORY_CARE_PROVIDER_SITE_OTHER): Admitting: Psychology

## 2023-06-09 DIAGNOSIS — F411 Generalized anxiety disorder: Secondary | ICD-10-CM | POA: Diagnosis not present

## 2023-06-09 NOTE — Progress Notes (Signed)
 [facility] Counselor/Therapist Progress Note  Patient ID: Daniel Miller, MRN: [id number],    Date: 06/09/2023  Time Spent: 57 mins   start time: 1103    end time: 1200  Treatment Type: Individual Therapy  Reported Symptoms: Pt presents in person for session in the office, granting consent for the session.    Mental Status Exam: Appearance:  Casual     Behavior: Appropriate  Motor: Normal  Speech/Language:  Clear and Coherent  Affect: Appropriate  Mood: normal  Thought process: normal  Thought content:   WNL  Sensory/Perceptual disturbances:   WNL  Orientation: oriented to person, place, and time/date  Attention: Good  Concentration: Good  Memory: WNL  Fund of knowledge:  Good  Insight:   Good  Judgment:  Good  Impulse Control: Good   Risk Assessment: Danger to Self:  No Self-injurious Behavior: No Danger to Others: No Duty to Warn:no Physical Aggression / Violence:No  Access to Firearms a concern: No  Gang Involvement:No   Subjective: Pt shares that he starts his new job at [employer] on Tuesday; his orientation will be remote; he is excited about getting started.  He is trying to leave his colleagues in a good place by completing tasks before he leaves.  He is looking forward to starting his new job with no carryover from [facility].  Pt shares Adriana Hopping was really stressed by his parents visit here last week; pt and his family are going to [place] in one month for his grandmother's celebration of life.  Savannah (43 yo) and Grady(42 yo) are getting ready for the end of school.  They have a lot of plans for the summer; camps, family reunion, the trip to [place].  Pt has had a couple of instances of being able to talk with his kids about him being in therapy and helping them understand what he is doing to be a better person.  Encouraged pt to continue with his self care activities and we will meet in 2 wks for a follow up session.  Interventions: Cognitive Behavioral  Therapy  Diagnosis:Generalized anxiety disorder  Plan: Treatment Plan Strengths/Abilities:  Intelligent, Intuitive, Willing to participate in therapy Treatment Preferences:  Outpatient Individual Therapy Statement of Needs:  Patient is to use CBT, mindfulness and coping skills to help manage and/or decrease symptoms associated with their diagnosis. Symptoms:  Depressed/Irritable mood, worry, social withdrawal Problems Addressed:  Depressive thoughts, Sadness, Sleep issues, etc. Long Term Goals:  Pt to reduce overall level, frequency, and intensity of the feelings of depression/anxiety as evidenced by decreased irritability, negative self talk, and helpless feelings from 6 to 7 days/week to 0 to 1 days/week, per client report, for at least 3 consecutive months.  Progress:  Short Term Goals:  Pt to verbally express understanding of the relationship between feelings of depression/anxiety and their impact on thinking patterns and behaviors.  Pt to verbalize an understanding of the role that distorted thinking plays in creating fears, excessive worry, and ruminations.  Progress:  Target Date:  05/18/2024 Frequency:  Bi-weekly Modality:  Cognitive Behavioral Therapy Interventions by Therapist:  Therapist will use CBT, Mindfulness exercises, Coping skills and Referrals, as needed by client. Client has verbally approved this treatment plan.  Daniel Miller, [facility]

## 2023-06-10 NOTE — Addendum Note (Signed)
 Addended by: Boone Buzzard B on: 06/10/2023 12:56 PM   Modules accepted: Level of Service

## 2024-02-17 ENCOUNTER — Other Ambulatory Visit (HOSPITAL_BASED_OUTPATIENT_CLINIC_OR_DEPARTMENT_OTHER): Payer: Self-pay

## 2024-02-17 MED ORDER — ALPRAZOLAM 0.25 MG PO TABS
0.1250 mg | ORAL_TABLET | Freq: Every day | ORAL | 0 refills | Status: AC | PRN
  Filled 2024-02-17: qty 30, 30d supply, fill #0
